# Patient Record
Sex: Female | Born: 1960 | Race: White | Hispanic: No | Marital: Married | Smoking: Current every day smoker
Health system: Southern US, Community
[De-identification: ages and names within clinical notes are randomized; demographics above are authoritative.]

## PROBLEM LIST (undated history)

## (undated) DIAGNOSIS — R7303 Prediabetes: Secondary | ICD-10-CM

## (undated) DIAGNOSIS — K5792 Diverticulitis of intestine, part unspecified, without perforation or abscess without bleeding: Secondary | ICD-10-CM

## (undated) DIAGNOSIS — J45909 Unspecified asthma, uncomplicated: Secondary | ICD-10-CM

## (undated) DIAGNOSIS — Z8616 Personal history of COVID-19: Secondary | ICD-10-CM

## (undated) DIAGNOSIS — M1990 Unspecified osteoarthritis, unspecified site: Secondary | ICD-10-CM

## (undated) DIAGNOSIS — G43909 Migraine, unspecified, not intractable, without status migrainosus: Secondary | ICD-10-CM

## (undated) DIAGNOSIS — R12 Heartburn: Secondary | ICD-10-CM

## (undated) DIAGNOSIS — N200 Calculus of kidney: Secondary | ICD-10-CM

## (undated) DIAGNOSIS — Z720 Tobacco use: Secondary | ICD-10-CM

## (undated) HISTORY — DX: Heartburn: R12

## (undated) HISTORY — DX: Tobacco use: Z72.0

## (undated) HISTORY — DX: Unspecified asthma, uncomplicated: J45.909

## (undated) HISTORY — DX: Personal history of COVID-19: Z86.16

## (undated) HISTORY — DX: Migraine, unspecified, not intractable, without status migrainosus: G43.909

## (undated) HISTORY — DX: Unspecified osteoarthritis, unspecified site: M19.90

## (undated) HISTORY — DX: Calculus of kidney: N20.0

## (undated) HISTORY — DX: Prediabetes: R73.03

## (undated) HISTORY — DX: Diverticulitis of intestine, part unspecified, without perforation or abscess without bleeding: K57.92

---
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
## 1982-02-16 HISTORY — PX: TOTAL ABDOMINAL HYSTERECTOMY: SHX209

## 1983-02-17 HISTORY — PX: APPENDECTOMY: SHX54

## 1991-02-17 HISTORY — PX: GALLBLADDER SURGERY: SHX652

## 2004-05-05 ENCOUNTER — Ambulatory Visit: Payer: Self-pay | Admitting: Family Medicine

## 2004-08-27 ENCOUNTER — Ambulatory Visit: Payer: Self-pay | Admitting: Family Medicine

## 2014-02-16 HISTORY — PX: ROTATOR CUFF REPAIR: SHX139

## 2015-07-24 DIAGNOSIS — M7751 Other enthesopathy of right foot: Secondary | ICD-10-CM | POA: Insufficient documentation

## 2016-06-11 DIAGNOSIS — M797 Fibromyalgia: Secondary | ICD-10-CM | POA: Diagnosis not present

## 2017-05-04 DIAGNOSIS — G8929 Other chronic pain: Secondary | ICD-10-CM | POA: Insufficient documentation

## 2017-05-04 DIAGNOSIS — M7918 Myalgia, other site: Secondary | ICD-10-CM | POA: Insufficient documentation

## 2017-06-23 DIAGNOSIS — L603 Nail dystrophy: Secondary | ICD-10-CM | POA: Insufficient documentation

## 2017-06-23 DIAGNOSIS — B351 Tinea unguium: Secondary | ICD-10-CM | POA: Insufficient documentation

## 2018-05-03 DIAGNOSIS — M549 Dorsalgia, unspecified: Secondary | ICD-10-CM | POA: Insufficient documentation

## 2018-07-15 DIAGNOSIS — M5412 Radiculopathy, cervical region: Secondary | ICD-10-CM | POA: Insufficient documentation

## 2020-02-17 HISTORY — PX: OTHER SURGICAL HISTORY: SHX169

## 2020-04-08 DIAGNOSIS — M545 Low back pain, unspecified: Secondary | ICD-10-CM | POA: Diagnosis not present

## 2020-04-08 DIAGNOSIS — M50122 Cervical disc disorder at C5-C6 level with radiculopathy: Secondary | ICD-10-CM | POA: Diagnosis not present

## 2020-04-08 DIAGNOSIS — M797 Fibromyalgia: Secondary | ICD-10-CM | POA: Diagnosis not present

## 2020-04-08 DIAGNOSIS — R202 Paresthesia of skin: Secondary | ICD-10-CM | POA: Diagnosis not present

## 2020-04-11 DIAGNOSIS — E538 Deficiency of other specified B group vitamins: Secondary | ICD-10-CM | POA: Diagnosis not present

## 2020-04-17 DIAGNOSIS — M4802 Spinal stenosis, cervical region: Secondary | ICD-10-CM | POA: Diagnosis not present

## 2020-04-23 DIAGNOSIS — Z1152 Encounter for screening for COVID-19: Secondary | ICD-10-CM | POA: Diagnosis not present

## 2020-04-25 DIAGNOSIS — Z6841 Body Mass Index (BMI) 40.0 and over, adult: Secondary | ICD-10-CM | POA: Diagnosis not present

## 2020-04-25 DIAGNOSIS — J019 Acute sinusitis, unspecified: Secondary | ICD-10-CM | POA: Diagnosis not present

## 2020-06-03 DIAGNOSIS — R69 Illness, unspecified: Secondary | ICD-10-CM | POA: Diagnosis not present

## 2020-06-03 DIAGNOSIS — Z6841 Body Mass Index (BMI) 40.0 and over, adult: Secondary | ICD-10-CM | POA: Diagnosis not present

## 2020-06-03 DIAGNOSIS — F410 Panic disorder [episodic paroxysmal anxiety] without agoraphobia: Secondary | ICD-10-CM | POA: Diagnosis not present

## 2020-06-13 DIAGNOSIS — R197 Diarrhea, unspecified: Secondary | ICD-10-CM | POA: Diagnosis not present

## 2020-06-13 DIAGNOSIS — Z6841 Body Mass Index (BMI) 40.0 and over, adult: Secondary | ICD-10-CM | POA: Diagnosis not present

## 2020-06-13 DIAGNOSIS — H6691 Otitis media, unspecified, right ear: Secondary | ICD-10-CM | POA: Diagnosis not present

## 2020-06-19 DIAGNOSIS — H52222 Regular astigmatism, left eye: Secondary | ICD-10-CM | POA: Diagnosis not present

## 2020-06-21 DIAGNOSIS — Z0100 Encounter for examination of eyes and vision without abnormal findings: Secondary | ICD-10-CM | POA: Diagnosis not present

## 2020-06-27 DIAGNOSIS — M50122 Cervical disc disorder at C5-C6 level with radiculopathy: Secondary | ICD-10-CM | POA: Diagnosis not present

## 2020-06-27 DIAGNOSIS — Z6841 Body Mass Index (BMI) 40.0 and over, adult: Secondary | ICD-10-CM | POA: Diagnosis not present

## 2020-06-27 DIAGNOSIS — M797 Fibromyalgia: Secondary | ICD-10-CM | POA: Diagnosis not present

## 2020-06-27 DIAGNOSIS — M792 Neuralgia and neuritis, unspecified: Secondary | ICD-10-CM | POA: Diagnosis not present

## 2020-07-08 DIAGNOSIS — J342 Deviated nasal septum: Secondary | ICD-10-CM | POA: Diagnosis not present

## 2020-07-08 DIAGNOSIS — H9193 Unspecified hearing loss, bilateral: Secondary | ICD-10-CM | POA: Diagnosis not present

## 2020-07-08 DIAGNOSIS — H9203 Otalgia, bilateral: Secondary | ICD-10-CM | POA: Diagnosis not present

## 2020-07-08 DIAGNOSIS — H7403 Tympanosclerosis, bilateral: Secondary | ICD-10-CM | POA: Diagnosis not present

## 2020-07-22 DIAGNOSIS — J069 Acute upper respiratory infection, unspecified: Secondary | ICD-10-CM | POA: Diagnosis not present

## 2020-07-22 DIAGNOSIS — Z1152 Encounter for screening for COVID-19: Secondary | ICD-10-CM | POA: Diagnosis not present

## 2020-07-22 DIAGNOSIS — R051 Acute cough: Secondary | ICD-10-CM | POA: Diagnosis not present

## 2020-07-22 DIAGNOSIS — R5383 Other fatigue: Secondary | ICD-10-CM | POA: Diagnosis not present

## 2020-07-31 DIAGNOSIS — H6981 Other specified disorders of Eustachian tube, right ear: Secondary | ICD-10-CM | POA: Diagnosis not present

## 2020-07-31 DIAGNOSIS — H6993 Unspecified Eustachian tube disorder, bilateral: Secondary | ICD-10-CM | POA: Insufficient documentation

## 2020-07-31 DIAGNOSIS — R69 Illness, unspecified: Secondary | ICD-10-CM | POA: Diagnosis not present

## 2020-08-07 DIAGNOSIS — H6983 Other specified disorders of Eustachian tube, bilateral: Secondary | ICD-10-CM | POA: Diagnosis not present

## 2020-08-21 DIAGNOSIS — J069 Acute upper respiratory infection, unspecified: Secondary | ICD-10-CM | POA: Diagnosis not present

## 2020-08-21 DIAGNOSIS — H9203 Otalgia, bilateral: Secondary | ICD-10-CM | POA: Diagnosis not present

## 2020-08-21 DIAGNOSIS — Z6841 Body Mass Index (BMI) 40.0 and over, adult: Secondary | ICD-10-CM | POA: Diagnosis not present

## 2020-09-30 DIAGNOSIS — R519 Headache, unspecified: Secondary | ICD-10-CM | POA: Diagnosis not present

## 2020-09-30 DIAGNOSIS — M542 Cervicalgia: Secondary | ICD-10-CM | POA: Diagnosis not present

## 2020-10-09 DIAGNOSIS — D485 Neoplasm of uncertain behavior of skin: Secondary | ICD-10-CM | POA: Diagnosis not present

## 2020-10-09 DIAGNOSIS — L821 Other seborrheic keratosis: Secondary | ICD-10-CM | POA: Diagnosis not present

## 2020-10-17 DIAGNOSIS — M4802 Spinal stenosis, cervical region: Secondary | ICD-10-CM | POA: Diagnosis not present

## 2020-10-28 DIAGNOSIS — M4802 Spinal stenosis, cervical region: Secondary | ICD-10-CM | POA: Diagnosis not present

## 2020-11-01 DIAGNOSIS — M4802 Spinal stenosis, cervical region: Secondary | ICD-10-CM | POA: Diagnosis not present

## 2020-11-01 DIAGNOSIS — M2578 Osteophyte, vertebrae: Secondary | ICD-10-CM | POA: Diagnosis not present

## 2020-11-01 DIAGNOSIS — M50322 Other cervical disc degeneration at C5-C6 level: Secondary | ICD-10-CM | POA: Diagnosis not present

## 2020-11-01 DIAGNOSIS — M4726 Other spondylosis with radiculopathy, lumbar region: Secondary | ICD-10-CM | POA: Diagnosis not present

## 2020-11-04 DIAGNOSIS — Z9889 Other specified postprocedural states: Secondary | ICD-10-CM | POA: Diagnosis not present

## 2020-11-04 DIAGNOSIS — H1589 Other disorders of sclera: Secondary | ICD-10-CM | POA: Diagnosis not present

## 2020-11-04 DIAGNOSIS — H1133 Conjunctival hemorrhage, bilateral: Secondary | ICD-10-CM | POA: Diagnosis not present

## 2020-11-25 DIAGNOSIS — R030 Elevated blood-pressure reading, without diagnosis of hypertension: Secondary | ICD-10-CM | POA: Diagnosis not present

## 2020-12-11 DIAGNOSIS — M4802 Spinal stenosis, cervical region: Secondary | ICD-10-CM | POA: Diagnosis not present

## 2020-12-23 DIAGNOSIS — K219 Gastro-esophageal reflux disease without esophagitis: Secondary | ICD-10-CM | POA: Diagnosis not present

## 2021-01-13 DIAGNOSIS — Z23 Encounter for immunization: Secondary | ICD-10-CM | POA: Diagnosis not present

## 2021-01-13 DIAGNOSIS — K219 Gastro-esophageal reflux disease without esophagitis: Secondary | ICD-10-CM | POA: Diagnosis not present

## 2021-01-15 DIAGNOSIS — M4802 Spinal stenosis, cervical region: Secondary | ICD-10-CM | POA: Diagnosis not present

## 2021-02-25 DIAGNOSIS — H5213 Myopia, bilateral: Secondary | ICD-10-CM | POA: Diagnosis not present

## 2021-03-03 DIAGNOSIS — M7918 Myalgia, other site: Secondary | ICD-10-CM | POA: Diagnosis not present

## 2021-03-03 DIAGNOSIS — M797 Fibromyalgia: Secondary | ICD-10-CM | POA: Diagnosis not present

## 2021-03-18 DIAGNOSIS — L209 Atopic dermatitis, unspecified: Secondary | ICD-10-CM | POA: Diagnosis not present

## 2021-03-19 DIAGNOSIS — M4802 Spinal stenosis, cervical region: Secondary | ICD-10-CM | POA: Diagnosis not present

## 2021-04-28 DIAGNOSIS — M542 Cervicalgia: Secondary | ICD-10-CM | POA: Diagnosis not present

## 2021-04-28 DIAGNOSIS — R519 Headache, unspecified: Secondary | ICD-10-CM | POA: Diagnosis not present

## 2021-05-05 DIAGNOSIS — J309 Allergic rhinitis, unspecified: Secondary | ICD-10-CM | POA: Diagnosis not present

## 2021-05-09 DIAGNOSIS — J309 Allergic rhinitis, unspecified: Secondary | ICD-10-CM | POA: Diagnosis not present

## 2021-05-12 DIAGNOSIS — H65492 Other chronic nonsuppurative otitis media, left ear: Secondary | ICD-10-CM | POA: Diagnosis not present

## 2021-05-12 DIAGNOSIS — B309 Viral conjunctivitis, unspecified: Secondary | ICD-10-CM | POA: Diagnosis not present

## 2021-05-12 DIAGNOSIS — R69 Illness, unspecified: Secondary | ICD-10-CM | POA: Diagnosis not present

## 2021-05-12 DIAGNOSIS — F17210 Nicotine dependence, cigarettes, uncomplicated: Secondary | ICD-10-CM | POA: Diagnosis not present

## 2021-05-12 DIAGNOSIS — J309 Allergic rhinitis, unspecified: Secondary | ICD-10-CM | POA: Diagnosis not present

## 2021-05-20 ENCOUNTER — Telehealth: Payer: Self-pay

## 2021-05-20 NOTE — Telephone Encounter (Signed)
Patient confirmed appointment.

## 2021-05-21 ENCOUNTER — Ambulatory Visit: Payer: Self-pay | Admitting: Legal Medicine

## 2021-05-27 ENCOUNTER — Encounter: Payer: Self-pay | Admitting: Legal Medicine

## 2021-05-27 ENCOUNTER — Ambulatory Visit (INDEPENDENT_AMBULATORY_CARE_PROVIDER_SITE_OTHER): Payer: Medicare HMO | Admitting: Legal Medicine

## 2021-05-27 VITALS — BP 112/82 | HR 72 | Temp 97.7°F | Ht 59.0 in | Wt 197.2 lb

## 2021-05-27 DIAGNOSIS — Z0000 Encounter for general adult medical examination without abnormal findings: Secondary | ICD-10-CM | POA: Diagnosis not present

## 2021-05-27 DIAGNOSIS — J0100 Acute maxillary sinusitis, unspecified: Secondary | ICD-10-CM | POA: Diagnosis not present

## 2021-05-27 DIAGNOSIS — J0140 Acute pansinusitis, unspecified: Secondary | ICD-10-CM | POA: Diagnosis not present

## 2021-05-27 DIAGNOSIS — F419 Anxiety disorder, unspecified: Secondary | ICD-10-CM | POA: Diagnosis not present

## 2021-05-27 DIAGNOSIS — R69 Illness, unspecified: Secondary | ICD-10-CM | POA: Diagnosis not present

## 2021-05-27 DIAGNOSIS — J019 Acute sinusitis, unspecified: Secondary | ICD-10-CM | POA: Insufficient documentation

## 2021-05-27 MED ORDER — DIAZEPAM 2 MG PO TABS: 2.0000 mg | ORAL_TABLET | Freq: Four times a day (QID) | ORAL | 3 refills | Status: DC | PRN

## 2021-05-27 MED ORDER — CLINDAMYCIN HCL 150 MG PO CAPS: 150.0000 mg | ORAL_CAPSULE | Freq: Three times a day (TID) | ORAL | 0 refills | Status: DC

## 2021-05-27 MED ORDER — TRIAMCINOLONE ACETONIDE 40 MG/ML IJ SUSP
80.0000 mg | Freq: Once | INTRAMUSCULAR | Status: AC
  Administered 2021-05-27: 80 mg via INTRAMUSCULAR

## 2021-05-27 NOTE — Progress Notes (Signed)
? ?Subjective:  ?Patient ID: Sherry Montgomery, female    DOB: 1960/11/12  Age: 61 y.o. MRN: [id number] ? ?Chief Complaint  ?Patient presents with  ? Establish Care  ? ? ?HPI: new patient ?  ?Patient is here to establish care. Patient is here to take about ear issues and headaches for 5 weeks. She has cough recently. No pulmonary disease or treatment.  She has eustation tube dysfunction and chronic tympanic tubes both sides. She is to see ENT  soon. ? ?She is having sinus type headaches ?Chronic anxiety which she uses diazepam. ?Patient including fibromyalgia presently on no medication says she is aching more recently.  She did not like her last physician.  She is to see ENT soon. ? ?Current Outpatient Medications on File Prior to Visit  ?Medication Sig Dispense Refill  ? Naproxen Sodium (ALEVE PO) Take 1 tablet by mouth daily as needed.    ? ?No current facility-administered medications on file prior to visit.  ? ?Past Medical History:  ?Diagnosis Date  ? Arthritis   ? Asthma   ? Diverticulitis   ? Heartburn   ? Kidney stones   ? Migraine   ? ?Past Surgical History:  ?Procedure Laterality Date  ? APPENDECTOMY  1985  ? disc fusion in neck  2022  ? GALLBLADDER SURGERY  1993  ? HERNIA REPAIR  1979  ? ROTATOR CUFF REPAIR  2016  ? TOTAL ABDOMINAL HYSTERECTOMY  1984  ?  ?Family History  ?Problem Relation Age of Onset  ? Alzheimer's disease Mother   ? Lupus Father   ? Lung cancer Sister   ? Leukemia Sister   ? Alzheimer's disease Sister   ? Alzheimer's disease Brother   ? ?Social History  ? ?Socioeconomic History  ? Marital status: Married  ?  Spouse name: Not on file  ? Number of children: 2  ? Years of education: Not on file  ? Highest education level: Not on file  ?Occupational History  ? Not on file  ?Tobacco Use  ? Smoking status: Every Day  ?  Packs/day: 0.50  ?  Types: Cigarettes  ? Smokeless tobacco: Never  ?Vaping Use  ? Vaping Use: Never used  ?Substance and Sexual Activity  ? Alcohol use: Never  ? Drug use:  Never  ? Sexual activity: Yes  ?  Partners: Male  ?Other Topics Concern  ? Not on file  ?Social History Narrative  ? Not on file  ? ?Social Determinants of Health  ? ?Financial Resource Strain: Not on file  ?Food Insecurity: Not on file  ?Transportation Needs: Not on file  ?Physical Activity: Not on file  ?Stress: Not on file  ?Social Connections: Not on file  ? ? ?Review of Systems  ?Constitutional:  Negative for appetite change, chills, fatigue and fever.  ?HENT:  Positive for congestion, ear pain, rhinorrhea and sore throat. Negative for ear discharge and sinus pressure.   ?Eyes:  Negative for visual disturbance.  ?Respiratory:  Positive for cough. Negative for chest tightness, shortness of breath and wheezing.   ?Cardiovascular:  Negative for chest pain, palpitations and leg swelling.  ?Gastrointestinal:  Negative for abdominal pain, diarrhea, nausea and vomiting.  ?Endocrine: Negative for polydipsia, polyphagia and polyuria.  ?Genitourinary:  Negative for difficulty urinating, dysuria, frequency, hematuria, menstrual problem, urgency, vaginal bleeding, vaginal discharge and vaginal pain.  ?Musculoskeletal:  Positive for joint swelling. Negative for back pain, gait problem, myalgias and neck pain.  ?Neurological:  Positive for headaches. Negative for  dizziness, seizures, syncope, weakness and numbness.  ?Psychiatric/Behavioral:  Negative for agitation, confusion, hallucinations, sleep disturbance and suicidal ideas. The patient is not nervous/anxious.   ? ? ?Objective:  ?BP 112/82   Pulse 72   Temp 97.7 ?F (36.5 ?C)   Ht '4\' 11"'$  (1.499 m)   Wt 197 lb 3.2 oz (89.4 kg)   SpO2 96%   BMI 39.83 kg/m?  ? ? ?  05/27/2021  ?  1:54 PM  ?BP/Weight  ?Systolic BP 409  ?Diastolic BP 82  ?Wt. (Lbs) 197.2  ?BMI 39.83 kg/m2  ? ? ?Physical Exam ?Vitals reviewed.  ?Constitutional:   ?   General: She is not in acute distress. ?   Appearance: Normal appearance.  ?HENT:  ?   Head: Normocephalic.  ?   Right Ear: Tympanic membrane  normal.  ?   Left Ear: Tympanic membrane normal.  ?   Nose: Congestion present.  ?   Mouth/Throat:  ?   Mouth: Mucous membranes are moist.  ?   Pharynx: Oropharynx is clear.  ?Eyes:  ?   Extraocular Movements: Extraocular movements intact.  ?   Conjunctiva/sclera: Conjunctivae normal.  ?   Pupils: Pupils are equal, round, and reactive to light.  ?Cardiovascular:  ?   Rate and Rhythm: Normal rate and regular rhythm.  ?   Pulses: Normal pulses.  ?   Heart sounds: Normal heart sounds. No murmur heard. ?  No gallop.  ?Pulmonary:  ?   Effort: Pulmonary effort is normal. No respiratory distress.  ?   Breath sounds: Normal breath sounds. No wheezing.  ?Abdominal:  ?   General: Abdomen is flat. Bowel sounds are normal. There is no distension.  ?   Palpations: Abdomen is soft.  ?   Tenderness: There is no abdominal tenderness.  ?Musculoskeletal:     ?   General: Normal range of motion.  ?   Cervical back: Normal range of motion and neck supple.  ?   Right lower leg: No edema.  ?   Left lower leg: No edema.  ?Skin: ?   General: Skin is warm and dry.  ?   Capillary Refill: Capillary refill takes less than 2 seconds.  ?Neurological:  ?   General: No focal deficit present.  ?   Mental Status: She is alert and oriented to person, place, and time. Mental status is at baseline.  ? ? ? ?  ? ?No results found for: WBC, HGB, HCT, PLT, GLUCOSE, CHOL, TRIG, HDL, LDLDIRECT, LDLCALC, ALT, AST, NA, K, CL, CREATININE, BUN, CO2, TSH, PSA, INR, GLUF, HGBA1C, MICROALBUR ? ? ? ?Assessment & Plan:  ? ?Diagnoses and all orders for this visit: ?Routine general medical examination at a health care facility ?-     CBC with Differential/Platelet ?-     Comprehensive metabolic panel ?-     Lipid panel ?Patient seen for routine physical examination and has been doing well still doing well woman check to get some routine laboratory and then referral back to [facility] she wants to see long-term. ?Acute non-recurrent maxillary sinusitis ?-     clindamycin  (CLEOCIN) 150 MG capsule; Take 1 capsule (150 mg total) by mouth 3 (three) times daily. ?-     triamcinolone acetonide (KENALOG-40) injection 80 mg ?Patient continues to have sinus infection as well as eustachian tube dysfunction and chronic indwelling tympanostomy tubes.  We did give her a second Kenalog 80 mg as well as starting her on clindamycin which is one of  the few antibiotics she can take she was seen the next day and actually is feeling well now.. ? ? ? ?  ? ?Follow-up: Return in about 2 months (around 07/27/2021) for shannon. ? ?An After Visit Summary was printed and given to the patient. ? ?Reinaldo Meeker, MD ?[facility] ?[phone]

## 2021-05-28 ENCOUNTER — Other Ambulatory Visit: Payer: Medicare HMO

## 2021-05-28 DIAGNOSIS — Z0000 Encounter for general adult medical examination without abnormal findings: Secondary | ICD-10-CM | POA: Diagnosis not present

## 2021-05-29 ENCOUNTER — Encounter: Payer: Self-pay | Admitting: Legal Medicine

## 2021-05-29 LAB — COMPREHENSIVE METABOLIC PANEL
ALT: 18 IU/L (ref 0–32)
AST: 15 IU/L (ref 0–40)
Albumin/Globulin Ratio: 1.3 (ref 1.2–2.2)
Albumin: 4 g/dL (ref 3.8–4.9)
Alkaline Phosphatase: 76 IU/L (ref 44–121)
BUN/Creatinine Ratio: 18 (ref 9–23)
BUN: 16 mg/dL (ref 6–24)
Bilirubin Total: 0.3 mg/dL (ref 0.0–1.2)
CO2: 23 mmol/L (ref 20–29)
Calcium: 9.4 mg/dL (ref 8.7–10.2)
Chloride: 106 mmol/L (ref 96–106)
Creatinine, Ser: 0.88 mg/dL (ref 0.57–1.00)
Globulin, Total: 3.1 g/dL (ref 1.5–4.5)
Glucose: 205 mg/dL — ABNORMAL HIGH (ref 70–99)
Potassium: 4.5 mmol/L (ref 3.5–5.2)
Sodium: 142 mmol/L (ref 134–144)
Total Protein: 7.1 g/dL (ref 6.0–8.5)
eGFR: 76 mL/min/{1.73_m2} (ref >59)

## 2021-05-29 LAB — CBC WITH DIFFERENTIAL/PLATELET
Basophils Absolute: 0.1 10*3/uL (ref 0.0–0.2)
Basos: 1 %
EOS (ABSOLUTE): 0.1 10*3/uL (ref 0.0–0.4)
Eos: 1 %
Hematocrit: 45 % (ref 34.0–46.6)
Hemoglobin: 15.4 g/dL (ref 11.1–15.9)
Immature Grans (Abs): 0 10*3/uL (ref 0.0–0.1)
Immature Granulocytes: 0 %
Lymphocytes Absolute: 2.2 10*3/uL (ref 0.7–3.1)
Lymphs: 18 %
MCH: 31.2 pg (ref 26.6–33.0)
MCHC: 34.2 g/dL (ref 31.5–35.7)
MCV: 91 fL (ref 79–97)
Monocytes Absolute: 0.6 10*3/uL (ref 0.1–0.9)
Monocytes: 5 %
Neutrophils Absolute: 9.5 10*3/uL — ABNORMAL HIGH (ref 1.4–7.0)
Neutrophils: 75 %
Platelets: 330 10*3/uL (ref 150–450)
RBC: 4.93 x10E6/uL (ref 3.77–5.28)
RDW: 11.7 % (ref 11.7–15.4)
WBC: 12.6 10*3/uL — ABNORMAL HIGH (ref 3.4–10.8)

## 2021-05-29 LAB — LIPID PANEL
Chol/HDL Ratio: 4.6 ratio — ABNORMAL HIGH (ref 0.0–4.4)
Cholesterol, Total: 190 mg/dL (ref 100–199)
HDL: 41 mg/dL (ref >39)
LDL Chol Calc (NIH): 125 mg/dL — ABNORMAL HIGH (ref 0–99)
Triglycerides: 133 mg/dL (ref 0–149)
VLDL Cholesterol Cal: 24 mg/dL (ref 5–40)

## 2021-05-29 LAB — CARDIOVASCULAR RISK ASSESSMENT

## 2021-05-29 NOTE — Progress Notes (Signed)
Wbc 12, 600, 9.5 % neutrophils may be from sinusitis, glucose 205, kidney tests normal, liver tests normal, LDL cholesterol 125 high, need DASH diet ?lp

## 2021-05-30 DIAGNOSIS — H524 Presbyopia: Secondary | ICD-10-CM | POA: Diagnosis not present

## 2021-05-30 DIAGNOSIS — H52209 Unspecified astigmatism, unspecified eye: Secondary | ICD-10-CM | POA: Diagnosis not present

## 2021-05-30 DIAGNOSIS — H5213 Myopia, bilateral: Secondary | ICD-10-CM | POA: Diagnosis not present

## 2021-06-02 ENCOUNTER — Telehealth: Payer: Self-pay | Admitting: Nurse Practitioner

## 2021-06-02 NOTE — Telephone Encounter (Signed)
Pt called stating she received a letter for jury duty. She stated she can not sit or stand within 30 mins do to her arthritis in her spine. I told her we would contact her back Larene Beach is out of the office. Waiting to hear back rather or not she will need an in office appt with np for letter for jury duty.  ?

## 2021-06-05 NOTE — Telephone Encounter (Addendum)
Patient called back to follow up on her [place] request. Patient was notified that this message will need to be addressed by Dr. Henrene Pastor being that Larene Beach has not seen her yet. Patient was notified that this message has been forward to Dr. Henrene Pastor and someone should try to contact her back this afternoon for tomorrow morning. ?

## 2021-06-06 NOTE — Telephone Encounter (Signed)
Patient was notified of what Dr. Henrene Pastor had stated. Patient asked that her medical records be reviewed again. 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 Patient stated to me that she is currently on disability due to her neck, arthritis, and fibromyalgia. Dr. Henrene Pastor, please let me know if you are able to do the letter or not and I will call her back to let her know. ?

## 2021-06-06 NOTE — Telephone Encounter (Signed)
Talk with Larene Beach and she will not find a medical reason to be dismissed from [employer]. I explained to patient. ?

## 2021-06-10 ENCOUNTER — Ambulatory Visit: Payer: Medicare HMO | Admitting: Nurse Practitioner

## 2021-06-11 DIAGNOSIS — M542 Cervicalgia: Secondary | ICD-10-CM | POA: Diagnosis not present

## 2021-06-11 DIAGNOSIS — R519 Headache, unspecified: Secondary | ICD-10-CM | POA: Diagnosis not present

## 2021-06-11 DIAGNOSIS — M4802 Spinal stenosis, cervical region: Secondary | ICD-10-CM | POA: Diagnosis not present

## 2021-06-11 DIAGNOSIS — Z981 Arthrodesis status: Secondary | ICD-10-CM | POA: Diagnosis not present

## 2021-06-11 DIAGNOSIS — I6782 Cerebral ischemia: Secondary | ICD-10-CM | POA: Diagnosis not present

## 2021-06-18 DIAGNOSIS — L301 Dyshidrosis [pompholyx]: Secondary | ICD-10-CM | POA: Diagnosis not present

## 2021-06-23 ENCOUNTER — Ambulatory Visit (INDEPENDENT_AMBULATORY_CARE_PROVIDER_SITE_OTHER): Payer: Medicare HMO | Admitting: Nurse Practitioner

## 2021-06-23 ENCOUNTER — Encounter: Payer: Self-pay | Admitting: Nurse Practitioner

## 2021-06-23 ENCOUNTER — Ambulatory Visit: Payer: Medicare HMO | Admitting: Physician Assistant

## 2021-06-23 VITALS — BP 124/80 | HR 92 | Temp 97.1°F | Ht 59.0 in | Wt 196.0 lb

## 2021-06-23 DIAGNOSIS — R739 Hyperglycemia, unspecified: Secondary | ICD-10-CM

## 2021-06-23 DIAGNOSIS — G43709 Chronic migraine without aura, not intractable, without status migrainosus: Secondary | ICD-10-CM

## 2021-06-23 MED ORDER — NURTEC 75 MG PO TBDP: 75.0000 mg | ORAL_TABLET | ORAL | 1 refills | Status: DC

## 2021-06-23 NOTE — Progress Notes (Signed)
? ?Acute Office Visit ? ?Subjective:  ? ? Patient ID: Sherry Montgomery, female    DOB: September 13, 1960, 61 y.o.   MRN: [id number] ? ?Chief Complaint  ?Patient presents with  ? Migraines  ? ? ?HPI: ?Patient is in today for chronic migraines. Onset was several years ago. States migraines had subsided years ago but returned after having COVID-03 Feb 2021. Symptoms include partial brief vision loss with a white spot, dark zig-zag lines, headache,"tingling scalp sensation" nausea, photophobia, and phonophobia.  Treatment has included Aleve. She has been evaluated by ENT and Optometry. . Past cervical surgery with hardware.  ? ?EMR review reveals elevated blood glucose of 205 on 05/28/21. Pt states she has had "borderline" elevated blood sugars in the past and family history of type 2 DM.  ? ?Past Medical History:  ?Diagnosis Date  ? Arthritis   ? Asthma   ? Diverticulitis   ? Heartburn   ? Kidney stones   ? Migraine   ? ? ?Past Surgical History:  ?Procedure Laterality Date  ? APPENDECTOMY  1985  ? disc fusion in neck  2022  ? GALLBLADDER SURGERY  1993  ? HERNIA REPAIR  1979  ? ROTATOR CUFF REPAIR  2016  ? TOTAL ABDOMINAL HYSTERECTOMY  1984  ? ? ?Family History  ?Problem Relation Age of Onset  ? Alzheimer's disease Mother   ? Lupus Father   ? Lung cancer Sister   ? Leukemia Sister   ? Alzheimer's disease Sister   ? Alzheimer's disease Brother   ? ? ?Social History  ? ?Socioeconomic History  ? Marital status: Married  ?  Spouse name: Not on file  ? Number of children: 2  ? Years of education: Not on file  ? Highest education level: Not on file  ?Occupational History  ? Not on file  ?Tobacco Use  ? Smoking status: Every Day  ?  Packs/day: 0.50  ?  Types: Cigarettes  ? Smokeless tobacco: Never  ?Vaping Use  ? Vaping Use: Never used  ?Substance and Sexual Activity  ? Alcohol use: Never  ? Drug use: Never  ? Sexual activity: Yes  ?  Partners: Male  ?Other Topics Concern  ? Not on file  ?Social History Narrative  ? Not on file   ? ?Social Determinants of Health  ? ?Financial Resource Strain: Not on file  ?Food Insecurity: Not on file  ?Transportation Needs: Not on file  ?Physical Activity: Not on file  ?Stress: Not on file  ?Social Connections: Not on file  ?Intimate Partner Violence: Not on file  ? ? ?Outpatient Medications Prior to Visit  ?Medication Sig Dispense Refill  ? diazepam (VALIUM) 2 MG tablet Take 1 tablet (2 mg total) by mouth every 6 (six) hours as needed. 30 tablet 3  ? Naproxen Sodium (ALEVE PO) Take 1 tablet by mouth daily as needed.    ? clindamycin (CLEOCIN) 150 MG capsule Take 1 capsule (150 mg total) by mouth 3 (three) times daily. 30 capsule 0  ? ?No facility-administered medications prior to visit.  ? ? ?Allergies  ?Allergen Reactions  ? Codeine Anaphylaxis  ?  Throat swelling ?Throat swelling ?Throat swelling ?  ? Shellfish Allergy Anaphylaxis  ?  Other reaction(s): Shock (ALLERGY) ?Other reaction(s): Shock (ALLERGY) ?  ? Ciprofloxacin Nausea And Vomiting  ? Erythromycin   ? Ibuprofen Other (See Comments)  ?  Other reaction(s): Sweating (intolerance) ?Other reaction(s): Sweating (intolerance) ?  ? Silver Nitrate Swelling  ? Sulfa Antibiotics   ?  Tramadol Other (See Comments)  ?  ask ?ask ?ask ?  ? Tramadol Hcl   ? Acetaminophen Rash  ? Ampicillin Rash  ? Cefdinir Rash  ? Cephalexin Rash  ? Doxycycline Rash  ? Fish-Derived Products Rash  ? Hydrocodone-Acetaminophen Nausea And Vomiting and Rash  ?  ask ?ask ?ask ?  ? Sulfamethoxazole Rash  ? ? ?Review of Systems  ?Constitutional:  Positive for fatigue.  ?Eyes:  Positive for photophobia and visual disturbance (intermittent).  ?Gastrointestinal:  Positive for nausea. Negative for vomiting.  ?Neurological:  Positive for headaches. Negative for dizziness.  ? ?   ?Objective:  ?  ?Physical Exam ?Vitals reviewed.  ?Constitutional:   ?   Appearance: Normal appearance.  ?HENT:  ?   Right Ear: Tympanic membrane normal.  ?   Left Ear: Tympanic membrane normal.  ?   Nose: No  congestion or rhinorrhea.  ?   Mouth/Throat:  ?   Pharynx: No posterior oropharyngeal erythema.  ?Eyes:  ?   Pupils: Pupils are equal, round, and reactive to light.  ?Skin: ?   General: Skin is warm and dry.  ?   Capillary Refill: Capillary refill takes less than 2 seconds.  ?Neurological:  ?   General: No focal deficit present.  ?   Mental Status: She is alert and oriented to person, place, and time.  ?Psychiatric:     ?   Mood and Affect: Mood normal.     ?   Behavior: Behavior normal.  ? ? ?BP 124/80   Pulse 92   Temp (!) 97.1 ?F (36.2 ?C)   Ht 4' 11"  (1.499 m)   Wt 196 lb (88.9 kg)   SpO2 95%   BMI 39.59 kg/m?   ?Wt Readings from Last 3 Encounters:  ?06/23/21 196 lb (88.9 kg)  ?05/27/21 197 lb 3.2 oz (89.4 kg)  ? ? ?Health Maintenance Due  ?Topic Date Due  ? COVID-19 Vaccine (1) Never done  ? HIV Screening  Never done  ? Hepatitis C Screening  Never done  ? TETANUS/TDAP  Never done  ? PAP SMEAR-Modifier  Never done  ? COLONOSCOPY (Pts 45-27yr Insurance coverage will need to be confirmed)  Never done  ? MAMMOGRAM  Never done  ? Zoster Vaccines- Shingrix (1 of 2) Never done  ? ? ? ?Lab Results  ?Component Value Date  ? WBC 12.6 (H) 05/28/2021  ? HGB 15.4 05/28/2021  ? HCT 45.0 05/28/2021  ? MCV 91 05/28/2021  ? PLT 330 05/28/2021  ? ?Lab Results  ?Component Value Date  ? NA 142 05/28/2021  ? K 4.5 05/28/2021  ? CO2 23 05/28/2021  ? GLUCOSE 205 (H) 05/28/2021  ? BUN 16 05/28/2021  ? CREATININE 0.88 05/28/2021  ? BILITOT 0.3 05/28/2021  ? ALKPHOS 76 05/28/2021  ? AST 15 05/28/2021  ? ALT 18 05/28/2021  ? PROT 7.1 05/28/2021  ? ALBUMIN 4.0 05/28/2021  ? CALCIUM 9.4 05/28/2021  ? EGFR 76 05/28/2021  ? ?Lab Results  ?Component Value Date  ? CHOL 190 05/28/2021  ? ?Lab Results  ?Component Value Date  ? HDL 41 05/28/2021  ? ?Lab Results  ?Component Value Date  ? LDLCALC 125 (H) 05/28/2021  ? ?Lab Results  ?Component Value Date  ? TRIG 133 05/28/2021  ? ?Lab Results  ?Component Value Date  ? CHOLHDL 4.6 (H)  05/28/2021  ? ? ? ?   ?Assessment & Plan:  ? ?1. Chronic migraine without aura without status migrainosus, not intractable ?-  Rimegepant Sulfate (NURTEC) 75 MG TBDP; Take 75 mg by mouth every other day.  Dispense: 30 tablet; Refill: 1 ? ?2. Blood glucose elevated ?- Hemoglobin A1c ?  ? ?Begin Nurtec 75 mg every other day for migraine ?Take Aleve as directed ?Follow-up in 4-weeks for migraine management ?We will call you with lab results ?  ? ?Follow-up: 4-weeks ? ?An After Visit Summary was printed and given to the patient. ? ?I, Rip Harbour, NP, have reviewed all documentation for this visit. The documentation on 06/23/21 for the exam, diagnosis, procedures, and orders are all accurate and complete.  ? ? ?Signed, ?Rip Harbour, NP ?[place] ?[phone] ?

## 2021-06-23 NOTE — Patient Instructions (Addendum)
Begin Nurtec 75 mg every other day for migraine ?Take Aleve as directed ?Follow-up in 4-weeks for migraine management ?We will call you with lab results ? ? ? ?Chronic Migraine Headache ?A migraine headache is throbbing pain that is usually on one side of the head. Migraines that keep coming back are called recurring migraines. A migraine is called a chronic migraine if it happens at least 15 days in a month for more than 3 months. ?Talk with your doctor about what things may bring on (trigger) your migraines. ?What are the causes? ?The exact cause of this condition is not known. A migraine may be caused when nerves in the brain become irritated and release chemicals that cause irritation and swelling (inflammation) of blood vessels. The irritation and swelling of the blood vessels causes pain. ?Migraines may be brought on or caused by: ?Smoking. ?Foods and drinks, such as: ?Cheese. ?Chocolate. ?Alcohol. ?Caffeine. ?Certain substances in some foods or drinks. ?Some medicines. ?Other things that may bring on a migraine include: ?Periods, for women. ?Stress. ?Not enough sleep or too much sleep. ?Feeling very tired. ?Bright lights or loud noises. ?Smells ?Weather changes and being at high altitude. ?What increases the risk? ?The following factors may make you more likely to have chronic migraine: ?Having migraines or family members who have them. ?Being very sad (depressed) or feeling worried or nervous (anxious). ?Taking a lot of pain medicine. ?Having problems sleeping. ?Having heart disease, diabetes, or being very overweight (obese). ?What are the signs or symptoms? ?Symptoms of this condition include: ?Pain that feels like it throbs. ?Pain that is usually only on one side of the head. In some cases, the pain may be on both sides of the head or around the head or neck. ?Very bad pain that keeps you from doing daily activities. ?Pain that gets worse with activity. ?Feeling like you may vomit (feeling nauseous) or  vomiting. ?Pain when you are around bright lights, loud noises, or activity. ?Being sensitive to bright lights, loud noises, or smells. ?Feeling dizzy. ?How is this treated? ?This condition is treated with: ?Medicines. These help to: ?Lessen pain and the feeling like you may vomit. ?Prevent migraines. ?Changes to your diet or sleep. ?Therapy. This might include: ?Relaxation training. ?Biofeedback. This is a treatment that teaches you to relax, use your brain to lower your heart rate, and control your breathing. ?Cognitive behavioral therapy (CBT). This therapy helps you set goals and follow up on the changes that you make. ?Acupuncture. ?Using a device that provides electrical stimulation to your nerves, which can help take away pain. ?Surgery, if the other treatments do not work. ?Follow these instructions at home: ?Medicines ?Take over-the-counter and prescription medicines only as told by your doctor. ?Ask your doctor if the medicine prescribed to you requires you to avoid driving or using machinery. ?Lifestyle ? ?Do not use any products that contain nicotine or tobacco, such as cigarettes, e-cigarettes, and chewing tobacco. If you need help quitting, ask your doctor. ?Do not drink alcohol. ?Get 7-9 hours of sleep each night. ?Lower the stress in your life. Ask your doctor about ways to do this. ?Stay at a healthy weight. Talk with your doctor if you need help losing weight. ?Get regular exercise. ?General instructions ? ?Keep a journal to find out if certain things bring on migraines. For example, write down: ?What you eat and drink. ?How much sleep you get. ?Any change to your diet or medicines. ?Lie down in a dark, quiet room when you have  a migraine. ?Try placing a cool towel over your head when you have a migraine. ?Keep lights dim if bright lights bother you or make your migraines worse. ?Keep all follow-up visits as told by your doctor. This is important. ?Where to find more information ?Coalition for  Headache and Migraine Patients (CHAMP): headachemigraine.org ?American Migraine Foundation: americanmigrainefoundation.org ?National Headache Foundation: headaches.org ?Contact a doctor if: ?Medicine does not help your migraine. ?Your pain keeps coming back. ?Get help right away if: ?Your migraine becomes really bad and medicine does not help. ?You have a stiff neck and fever. ?You have trouble seeing. ?Your muscles are weak or you lose control of them. ?You lose your balance or have trouble walking. ?You feel like you will faint or you faint. ?You start having sudden, very bad headaches. ?You have a seizure. ?Summary ?A migraine headache is very bad, throbbing pain that is usually on one side of the head. ?A chronic migraine is a migraine that happens 15 days in a month for more than 3 months. ?Talk with your doctor about what things may bring on your migraines. ?Lie down in a dark, quiet room when you have a migraine. ?Keep a journal. This can help you find out if certain things make you have migraines. ?This information is not intended to replace advice given to you by your health care provider. Make sure you discuss any questions you have with your health care provider. ?Document Revised: 03/22/2019 Document Reviewed: 03/22/2019 ?Elsevier Patient Education ? [place]. ? ?

## 2021-06-24 ENCOUNTER — Encounter: Payer: Self-pay | Admitting: Nurse Practitioner

## 2021-06-24 LAB — HEMOGLOBIN A1C
Est. average glucose Bld gHb Est-mCnc: 146 mg/dL
Hgb A1c MFr Bld: 6.7 % — ABNORMAL HIGH (ref 4.8–5.6)

## 2021-06-25 ENCOUNTER — Telehealth: Payer: Self-pay

## 2021-06-25 NOTE — Telephone Encounter (Signed)
Prior auth for Nurtec 75 mg is approved.  ?

## 2021-06-26 ENCOUNTER — Other Ambulatory Visit: Payer: Self-pay

## 2021-06-26 MED ORDER — METFORMIN HCL 500 MG PO TABS: 500.0000 mg | ORAL_TABLET | Freq: Two times a day (BID) | ORAL | 0 refills | Status: DC

## 2021-07-01 ENCOUNTER — Ambulatory Visit (INDEPENDENT_AMBULATORY_CARE_PROVIDER_SITE_OTHER): Payer: Medicare HMO

## 2021-07-01 DIAGNOSIS — Z1231 Encounter for screening mammogram for malignant neoplasm of breast: Secondary | ICD-10-CM

## 2021-07-01 DIAGNOSIS — Z13820 Encounter for screening for osteoporosis: Secondary | ICD-10-CM

## 2021-07-01 DIAGNOSIS — Z0000 Encounter for general adult medical examination without abnormal findings: Secondary | ICD-10-CM

## 2021-07-01 NOTE — Progress Notes (Signed)
Subjective:   Sherry Montgomery is a 61 y.o. female who presents for Medicare Annual (Subsequent) preventive examination.  I connected with  Sherry Montgomery on 07/01/21 by a audio enabled telemedicine application and verified that I am speaking with the correct person using two identifiers.  Patient Location: Home  Provider Location: Home Office  I discussed the limitations of evaluation and management by telemedicine. The patient expressed understanding and agreed to proceed.  Cardiac Risk Factors include: advanced age (>38mn, >>102women)     Objective:    There were no vitals filed for this visit. There is no height or weight on file to calculate BMI.     07/01/2021    2:12 PM  Advanced Directives  Does Patient Have a Medical Advance Directive? No  Would patient like information on creating a medical advance directive? No - Patient declined    Current Medications (verified) Outpatient Encounter Medications as of 07/01/2021  Medication Sig   diazepam (VALIUM) 2 MG tablet Take 1 tablet (2 mg total) by mouth every 6 (six) hours as needed.   metFORMIN (GLUCOPHAGE) 500 MG tablet Take 1 tablet (500 mg total) by mouth 2 (two) times daily with a meal.   Naproxen Sodium (ALEVE PO) Take 1 tablet by mouth daily as needed.   Rimegepant Sulfate (NURTEC) 75 MG TBDP Take 75 mg by mouth every other day.   No facility-administered encounter medications on file as of 07/01/2021.    Allergies (verified) Codeine, Shellfish allergy, Ciprofloxacin, Erythromycin, Ibuprofen, Silver nitrate, Sulfa antibiotics, Tramadol, Tramadol hcl, Acetaminophen, Ampicillin, Cefdinir, Cephalexin, Doxycycline, Fish-derived products, Hydrocodone-acetaminophen, and Sulfamethoxazole   History: Past Medical History:  Diagnosis Date   Arthritis    Asthma    Diverticulitis    Heartburn    History of COVID-19    Kidney stones    Migraine    Past Surgical History:  Procedure Laterality Date   APPENDECTOMY   1985   disc fusion in neck  2022   [place]  ROTATOR CUFF REPAIR  2016   TOTAL ABDOMINAL HYSTERECTOMY  1984   Family History  Problem Relation Age of Onset   Alzheimer's disease Mother    Lupus Father    Lung cancer Sister    Leukemia Sister    Alzheimer's disease Sister    Alzheimer's disease Brother    Social History   Socioeconomic History   Marital status: Married    Spouse name: Not on file   Number of children: 2   Years of education: Not on file   Highest education level: Not on file  Occupational History   Not on file  Tobacco Use   Smoking status: Every Day    Packs/day: 0.50    Types: Cigarettes   Smokeless tobacco: Never  Vaping Use   Vaping Use: Never used  Substance and Sexual Activity   Alcohol use: Never   Drug use: Never   Sexual activity: Yes    Partners: Male  Other Topics Concern   Not on file  Social History Narrative   Not on file   Social Determinants of Health   Financial Resource Strain: Low Risk    Difficulty of Paying Living Expenses: Not hard at all  Food Insecurity: No Food Insecurity   Worried About RCharity fundraiserin the Last Year: Never true   Ran Out of Food in the Last Year: Never true  Transportation Needs: No Transportation  Needs   Lack of Transportation (Medical): No   Lack of Transportation (Non-Medical): No  Physical Activity: Inactive   Days of Exercise per Week: 0 days   Minutes of Exercise per Session: 0 min  Stress: No Stress Concern Present   Feeling of Stress : Not at all  Social Connections: Moderately Integrated   Frequency of Communication with Friends and Family: Three times a week   Frequency of Social Gatherings with Friends and Family: Once a week   Attends Religious Services: More than 4 times per year   Active Member of [employer] or Organizations: No   Attends Archivist Meetings: Never   Marital Status: Married    Tobacco Counseling Ready to quit:  Not Answered Counseling given: Not Answered   Clinical Intake:                 Diabetic?Yes          Activities of Daily Living    07/01/2021    2:12 PM  In your present state of health, do you have any difficulty performing the following activities:  Hearing? 0  Vision? 0  Difficulty concentrating or making decisions? 1  Walking or climbing stairs? 0  Dressing or bathing? 0  Doing errands, shopping? 0  Preparing Food and eating ? N  Using the Toilet? N  In the past six months, have you accidently leaked urine? N  Do you have problems with loss of bowel control? N  Managing your Medications? N  Managing your Finances? N  Housekeeping or managing your Housekeeping? N    Patient Care Team: Rip Harbour, NP as PCP - General (Nurse Practitioner)  Indicate any recent Medical Services you may have received from other than [facility] providers in the past year (date may be approximate).     Assessment:   This is a routine wellness examination for Sherry Montgomery.  Hearing/Vision screen No results found.  Dietary issues and exercise activities discussed: Current Exercise Habits: The patient does not participate in regular exercise at present, Exercise limited by: None identified   Goals Addressed   None   Depression Screen    07/01/2021    2:10 PM 05/27/2021    1:58 PM  PHQ 2/9 Scores  PHQ - 2 Score 0 0    Fall Risk    07/01/2021    2:12 PM 05/27/2021    1:58 PM  [place] in the past year? 0 0  Number falls in past yr: 0 0  Injury with Fall? 0 0  Risk for fall due to : No Fall Risks   Follow up Falls prevention discussed     [place]:  Any stairs in or around the home? Yes  If so, are there any without handrails? Yes  Home free of loose throw rugs in walkways, pet beds, electrical cords, etc? Yes  Adequate lighting in your home to reduce risk of falls? Yes   ASSISTIVE DEVICES UTILIZED TO PREVENT  FALLS:  Life alert? No  Use of a cane, walker or w/c? No  Grab bars in the bathroom? No  Shower chair or bench in shower? No  Elevated toilet seat or a handicapped toilet? Yes   TIMED UP AND GO:  Was the test performed?  N/A .  Length of time to ambulate 10 feet: N/A sec.    Cognitive Function:        Immunizations  There is no immunization  history on file for this patient.  TDAP status: Due, Education has been provided regarding the importance of this vaccine. Advised may receive this vaccine at local pharmacy or Health Dept. Aware to provide a copy of the vaccination record if obtained from local pharmacy or Health Dept. Verbalized acceptance and understanding.  Flu Vaccine status: Up to date  Pneumococcal vaccine status: Declined,  Education has been provided regarding the importance of this vaccine but patient still declined. Advised may receive this vaccine at local pharmacy or Health Dept. Aware to provide a copy of the vaccination record if obtained from local pharmacy or Health Dept. Verbalized acceptance and understanding.   Covid-19 vaccine status: Declined, Education has been provided regarding the importance of this vaccine but patient still declined. Advised may receive this vaccine at local pharmacy or Health Dept.or vaccine clinic. Aware to provide a copy of the vaccination record if obtained from local pharmacy or Health Dept. Verbalized acceptance and understanding.  Qualifies for Shingles Vaccine? Yes   Zostavax completed No   Shingrix Completed?: No.    Education has been provided regarding the importance of this vaccine. Patient has been advised to call insurance company to determine out of pocket expense if they have not yet received this vaccine. Advised may also receive vaccine at local pharmacy or Health Dept. Verbalized acceptance and understanding.  Screening Tests Health Maintenance  Topic Date Due   COVID-19 Vaccine (1) Never done   HIV Screening   Never done   Hepatitis C Screening  Never done   TETANUS/TDAP  Never done   PAP SMEAR-Modifier  Never done   COLONOSCOPY (Pts 45-58yr Insurance coverage will need to be confirmed)  Never done   MAMMOGRAM  Never done   Zoster Vaccines- Shingrix (1 of 2) Never done   INFLUENZA VACCINE  09/16/2021   HPV VACCINES  Aged Out    Health Maintenance  Health Maintenance Due  Topic Date Due   COVID-19 Vaccine (1) Never done   HIV Screening  Never done   Hepatitis C Screening  Never done   TETANUS/TDAP  Never done   PAP SMEAR-Modifier  Never done   COLONOSCOPY (Pts 45-442yrInsurance coverage will need to be confirmed)  Never done   MAMMOGRAM  Never done   Zoster Vaccines- Shingrix (1 of 2) Never done    Colorectal cancer screening: Type of screening: Colonoscopy. Completed  . Repeat every 10 years  Mammogram status: Ordered 07/01/2021. Pt provided with contact info and advised to call to schedule appt.   Bone Density status: Ordered 07/01/2021. Pt provided with contact info and advised to call to schedule appt.  Lung Cancer Screening: (Low Dose CT Chest recommended if Age 60-80ears, 30 pack-year currently smoking OR have quit w/in 15years.) does qualify.   Lung Cancer Screening Referral: Patient Denied  Additional Screening:  Hepatitis C Screening: does qualify; Completed Patient denied  Vision Screening: Recommended annual ophthalmology exams for early detection of glaucoma and other disorders of the eye. Is the patient up to date with their annual eye exam?  Yes  Who is the provider or what is the name of the office in which the patient attends annual eye exams? [facility] provider, would they like to be referred to a provider to establish care? No .   Dental Screening: Recommended annual dental exams for proper oral hygiene  Community Resource Referral / Chronic Care Management: CRR required this visit?  No  CCM required this visit?   No      Plan:     I have personally reviewed and noted the following in the patient's chart:   Medical and social history Use of alcohol, tobacco or illicit drugs  Current medications and supplements including opioid prescriptions.  Functional ability and status Nutritional status Physical activity Advanced directives List of other physicians Hospitalizations, surgeries, and ER visits in previous 12 months Vitals Screenings to include cognitive, depression, and falls Referrals and appointments  In addition, I have reviewed and discussed with patient certain preventive protocols, quality metrics, and best practice recommendations. A written personalized care plan for preventive services as well as general preventive health recommendations were provided to patient.     Burna Forts, [place]   07/01/2021   Nurse Notes: Non Face to Face, 40 minutes  Ms. Mccants , Thank you for taking time to come for your Medicare Wellness Visit. I appreciate your ongoing commitment to your health goals. Please review the following plan we discussed and let me know if I can assist you in the future.   These are the goals we discussed:  Goals   None     This is a list of the screening recommended for you and due dates:  Health Maintenance  Topic Date Due   COVID-19 Vaccine (1) Never done   HIV Screening  Never done   Hepatitis C Screening: USPSTF Recommendation to screen - Ages 25-79 yo.  Never done   Tetanus Vaccine  Never done   Pap Smear  Never done   Colon Cancer Screening  Never done   Mammogram  Never done   Zoster (Shingles) Vaccine (1 of 2) Never done   Flu Shot  09/16/2021   HPV Vaccine  Aged Out

## 2021-07-08 ENCOUNTER — Encounter: Payer: Self-pay | Admitting: Nurse Practitioner

## 2021-07-09 ENCOUNTER — Ambulatory Visit
Admission: RE | Admit: 2021-07-09 | Discharge: 2021-07-09 | Disposition: A | Discharge status: Home / Self Care | Payer: Medicare HMO | Source: Ambulatory Visit | Attending: Nurse Practitioner | Admitting: Nurse Practitioner

## 2021-07-09 ENCOUNTER — Other Ambulatory Visit: Payer: Self-pay | Admitting: Nurse Practitioner

## 2021-07-09 DIAGNOSIS — Z1231 Encounter for screening mammogram for malignant neoplasm of breast: Secondary | ICD-10-CM

## 2021-07-09 DIAGNOSIS — Z0000 Encounter for general adult medical examination without abnormal findings: Secondary | ICD-10-CM

## 2021-07-29 ENCOUNTER — Ambulatory Visit: Payer: Medicare HMO | Admitting: Nurse Practitioner

## 2021-07-31 DIAGNOSIS — N959 Unspecified menopausal and perimenopausal disorder: Secondary | ICD-10-CM | POA: Diagnosis not present

## 2021-07-31 DIAGNOSIS — M85851 Other specified disorders of bone density and structure, right thigh: Secondary | ICD-10-CM | POA: Diagnosis not present

## 2021-07-31 LAB — DG BONE DENSITY

## 2021-08-01 ENCOUNTER — Other Ambulatory Visit: Payer: Self-pay

## 2021-08-01 DIAGNOSIS — Z0000 Encounter for general adult medical examination without abnormal findings: Secondary | ICD-10-CM

## 2021-08-01 DIAGNOSIS — Z13820 Encounter for screening for osteoporosis: Secondary | ICD-10-CM

## 2021-08-01 DIAGNOSIS — M85859 Other specified disorders of bone density and structure, unspecified thigh: Secondary | ICD-10-CM

## 2021-08-01 MED ORDER — ALENDRONATE SODIUM 70 MG PO TABS: 70.0000 mg | ORAL_TABLET | ORAL | 11 refills | Status: DC

## 2021-08-01 NOTE — Progress Notes (Signed)
Fosamax sent for osteopenia per [place] -- patient's mailbox was full, mychart message sent.

## 2021-08-18 ENCOUNTER — Telehealth: Payer: Self-pay

## 2021-08-18 NOTE — Telephone Encounter (Addendum)
Patient called stated she is having lower abd and back pain, and pain in her groin on the left side. Stated she has had kidney stones before and feels that what is, was wonder if she could have something for pain. Please advise.  Per shannon patient should go to Urgent care or ER to be seen.  Patient verbalized understanding.

## 2021-08-28 NOTE — Progress Notes (Signed)
Chief Complaint  Patient presents with   Migraine   Diabetes    Subjective:  Patient ID: Sherry Montgomery, female    DOB: 30-Dec-1960  Age: 61 y.o. MRN: [id number]    HPI  Sherry Montgomery presents for follow-up of T2DM that was diagnosed this May 2023. She was prescribed Metformin 500 mg BID. She has discontinued medication on her own. States she has modified her diet and increased physical activity. States she hears bilateral "whooshing" in her ears and mild dizziness when turning her head intermittently. 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 Reports she has been swimming in her pool regularly, has had fluid drain from her right ear. She is a long-term cigarette smoker.     Diabetes Mellitus Type II, Follow-up  Lab Results  Component Value Date   HGBA1C 6.7 (H) 06/23/2021   Wt Readings from Last 3 Encounters:  08/29/21 194 lb (88 kg)  06/23/21 196 lb (88.9 kg)  05/27/21 197 lb 3.2 oz (89.4 kg)   Last seen for diabetes 6 months ago.  Management  includes diet and exercise. D/c metformin on her own. She reports poor compliance with treatment.  Home blood sugar records: fasting range: 104-130  Most Recent Eye Exam:Feb 2023 Current exercise: none Current diet habits: in general, a "healthy" diet    Pertinent Labs: Lab Results  Component Value Date   CHOL 190 05/28/2021   HDL 41 05/28/2021   LDLCALC 125 (H) 05/28/2021   TRIG 133 05/28/2021   CHOLHDL 4.6 (H) 05/28/2021   Lab Results  Component Value Date   NA 142 05/28/2021   K 4.5 05/28/2021   CREATININE 0.88 05/28/2021   EGFR 76 05/28/2021   GLUCOSE 205 (H) 05/28/2021         Migraines: Patient states she never started nurtec and her migraines subsided after her husbands stress test resulted as normal. Current Outpatient Medications on File Prior to Visit  Medication Sig Dispense Refill   alendronate (FOSAMAX) 70 MG tablet Take 1 tablet (70 mg total) by mouth once a week. Take with a full glass  of water on an empty stomach. 4 tablet 11   diazepam (VALIUM) 2 MG tablet Take 1 tablet (2 mg total) by mouth every 6 (six) hours as needed. 30 tablet 3   metFORMIN (GLUCOPHAGE) 500 MG tablet Take 1 tablet (500 mg total) by mouth 2 (two) times daily with a meal. (Patient not taking: Reported on 08/29/2021) 180 tablet 0   Naproxen Sodium (ALEVE PO) Take 1 tablet by mouth daily as needed.     Rimegepant Sulfate (NURTEC) 75 MG TBDP Take 75 mg by mouth every other day. (Patient not taking: Reported on 08/29/2021) 30 tablet 1   No current facility-administered medications on file prior to visit.   Past Medical History:  Diagnosis Date   Arthritis    Asthma    Diverticulitis    Heartburn    History of COVID-19    Kidney stones    Migraine    Past Surgical History:  Procedure Laterality Date   APPENDECTOMY  1985   disc fusion in neck  2022   [place]   ROTATOR CUFF REPAIR  2016   TOTAL ABDOMINAL HYSTERECTOMY  1984    Family History  Problem Relation Age of Onset   Alzheimer's disease Mother    Lupus Father    Lung cancer Sister    Leukemia Sister  Alzheimer's disease Sister    Alzheimer's disease Brother    Social History   Socioeconomic History   Marital status: Married    Spouse name: Not on file   Number of children: 2   Years of education: Not on file   Highest education level: Not on file  Occupational History   Not on file  Tobacco Use   Smoking status: Every Day    Packs/day: 0.50    Types: Cigarettes   Smokeless tobacco: Never  Vaping Use   Vaping Use: Never used  Substance and Sexual Activity   Alcohol use: Never   Drug use: Never   Sexual activity: Yes    Partners: Male  Other Topics Concern   Not on file  Social History Narrative   Not on file   Social Determinants of Health   Financial Resource Strain: Low Risk  (07/01/2021)   Overall Financial Resource Strain (CARDIA)    Difficulty of Paying Living  Expenses: Not hard at all  Food Insecurity: No Food Insecurity (07/01/2021)   Hunger Vital Sign    Worried About Running Out of Food in the Last Year: Never true    [place] in the Last Year: Never true  Transportation Needs: No Transportation Needs (07/01/2021)   PRAPARE - Hydrologist (Medical): No    Lack of Transportation (Non-Medical): No  Physical Activity: Inactive (07/01/2021)   Exercise Vital Sign    Days of Exercise per Week: 0 days    Minutes of Exercise per Session: 0 min  Stress: No Stress Concern Present (07/01/2021)   [place]    Feeling of Stress : Not at all  Social Connections: Moderately Integrated (07/01/2021)   Social Connection and Isolation Panel [NHANES]    Frequency of Communication with Friends and Family: Three times a week    Frequency of Social Gatherings with Friends and Family: Once a week    Attends Religious Services: More than 4 times per year    Active Member of [employer] or Organizations: No    Attends Archivist Meetings: Never    Marital Status: Married    Review of Systems  Constitutional:  Negative for chills, fatigue and fever.  HENT:  Negative for congestion, ear pain, rhinorrhea and sore throat.   Respiratory:  Negative for cough and shortness of breath.   Cardiovascular:  Negative for chest pain.  Gastrointestinal:  Negative for abdominal pain, constipation, diarrhea, nausea and vomiting.  Genitourinary:  Negative for dysuria and urgency.  Musculoskeletal:  Negative for back pain and myalgias.  Neurological:  Negative for dizziness, weakness, light-headedness and headaches.  Psychiatric/Behavioral:  Negative for dysphoric mood. The patient is not nervous/anxious.      Objective:  BP 130/74   Pulse 74   Temp (!) 96 F (35.6 C)   Ht _0  (1.499 m)   Wt 194 lb (88 kg)   SpO2 97%   BMI 39.18 kg/m      08/29/2021    8:56 AM  06/23/2021   10:38 AM 05/27/2021    1:54 PM  BP/Weight  Systolic BP 681 157 262  Diastolic BP 74 80 82  Wt. (Lbs) 194 196 197.2  BMI 39.18 kg/m2 39.59 kg/m2 39.83 kg/m2    Physical Exam Vitals reviewed.  Constitutional:      Appearance: Normal appearance. She is normal weight.  Neck:     Vascular: No carotid bruit.  Cardiovascular:     Rate and Rhythm: Normal rate and regular rhythm.     Heart sounds: Normal heart sounds.  Pulmonary:     Effort: Pulmonary effort is normal. No respiratory distress.     Breath sounds: Normal breath sounds.  Abdominal:     General: Abdomen is flat. Bowel sounds are normal.     Palpations: Abdomen is soft.     Tenderness: There is no abdominal tenderness.  Neurological:     Mental Status: She is alert and oriented to person, place, and time.  Psychiatric:        Mood and Affect: Mood normal.        Behavior: Behavior normal.      Lab Results  Component Value Date   WBC 12.6 (H) 05/28/2021   HGB 15.4 05/28/2021   HCT 45.0 05/28/2021   PLT 330 05/28/2021   GLUCOSE 205 (H) 05/28/2021   CHOL 190 05/28/2021   TRIG 133 05/28/2021   HDL 41 05/28/2021   LDLCALC 125 (H) 05/28/2021   ALT 18 05/28/2021   AST 15 05/28/2021   NA 142 05/28/2021   K 4.5 05/28/2021   CL 106 05/28/2021   CREATININE 0.88 05/28/2021   BUN 16 05/28/2021   CO2 23 05/28/2021   HGBA1C 6.7 (H) 06/23/2021      Assessment & Plan:   1. Type 2 diabetes mellitus with hyperglycemia, without long-term current use of insulin (HCC)-not at goal - Lipid panel - Comprehensive metabolic panel - CBC with Differential/Platelet - Ambulatory referral to diabetic education -pt declined treatment with Metformin  2. Hyperlipidemia associated with type 2 diabetes mellitus (HCC)-not at goal - Lipid panel - Comprehensive metabolic panel - CBC with Differential/Platelet - Ambulatory referral to diabetic education -heart healthy diet  3. Pulsatile tinnitus of both ears - US  Carotid Bilateral  4. Atherosclerotic cardiovascular disease - Lipid panel - Comprehensive metabolic panel - CBC with Differential/Platelet - US Carotid Bilateral - Ambulatory referral to diabetic education -smoking cessation recommended  5. Cigarette nicotine dependence with other nicotine-induced disorder - Lipid panel - Comprehensive metabolic panel - CBC with Differential/Platelet - US Carotid Bilateral -recommend smoking cessation  6. Vaccination declined  7. Statin declined        We will call you with lab results, carotid ultrasound, diabetes education referral Continue medications Follow-up in 31-month, fasting  Follow-up: 379-monthfasting  An After Visit Summary was printed and given to the patient.  I, ShRip HarbourNP, have reviewed all documentation for this visit. The documentation on 08/29/21 for the exam, diagnosis, procedures, and orders are all accurate and complete.   ShRip HarbourNP [phone]

## 2021-08-29 ENCOUNTER — Ambulatory Visit (INDEPENDENT_AMBULATORY_CARE_PROVIDER_SITE_OTHER): Payer: Medicare HMO | Admitting: Nurse Practitioner

## 2021-08-29 ENCOUNTER — Encounter: Payer: Self-pay | Admitting: Nurse Practitioner

## 2021-08-29 VITALS — BP 130/74 | HR 74 | Temp 96.0°F | Ht 59.0 in | Wt 194.0 lb

## 2021-08-29 DIAGNOSIS — F17218 Nicotine dependence, cigarettes, with other nicotine-induced disorders: Secondary | ICD-10-CM

## 2021-08-29 DIAGNOSIS — H93A3 Pulsatile tinnitus, bilateral: Secondary | ICD-10-CM | POA: Diagnosis not present

## 2021-08-29 DIAGNOSIS — Z5320 Procedure and treatment not carried out because of patient's decision for unspecified reasons: Secondary | ICD-10-CM

## 2021-08-29 DIAGNOSIS — E1169 Type 2 diabetes mellitus with other specified complication: Secondary | ICD-10-CM

## 2021-08-29 DIAGNOSIS — E1165 Type 2 diabetes mellitus with hyperglycemia: Secondary | ICD-10-CM

## 2021-08-29 DIAGNOSIS — I2510 Atherosclerotic heart disease of native coronary artery without angina pectoris: Secondary | ICD-10-CM

## 2021-08-29 DIAGNOSIS — Z2821 Immunization not carried out because of patient refusal: Secondary | ICD-10-CM | POA: Diagnosis not present

## 2021-08-29 DIAGNOSIS — R69 Illness, unspecified: Secondary | ICD-10-CM | POA: Diagnosis not present

## 2021-08-29 DIAGNOSIS — E785 Hyperlipidemia, unspecified: Secondary | ICD-10-CM

## 2021-08-29 DIAGNOSIS — E118 Type 2 diabetes mellitus with unspecified complications: Secondary | ICD-10-CM | POA: Diagnosis not present

## 2021-08-29 NOTE — Patient Instructions (Addendum)
We will call you with lab results, carotid ultrasound, diabetes education referral Continue medications Follow-up in 27-month, fasting Atherosclerosis  Atherosclerosis is when plaque builds up in the arteries. This causes narrowing and hardening of the arteries. Arteries are blood vessels that carry blood from the heart to all parts of the body. This blood contains oxygen. Plaque occurs due to inflammation or from a buildup of fat, cholesterol, calcium, waste products of cells, and a clotting material in the blood (fibrin). Plaque decreases the amount of blood that can flow through the artery. Atherosclerosis can affect any artery in your body, including: Heart arteries. Damage to these arteries may lead to coronary artery disease, which can cause a heart attack. Brain arteries. Damage to these arteries may cause a stroke. Leg, arm, and pelvis arteries. Peripheral artery disease (PAD) may result from damage to these arteries. Kidney arteries. Kidney (renal) failure may result from damage to kidney arteries. Treatment may slow the disease and prevent further damage to your heart, brain, peripheral arteries, and kidneys. What are the causes? This condition develops slowly over many years. The inner layers of your arteries become damaged and allow the gradual buildup of plaque. The exact cause of atherosclerosis is not fully understood. Symptoms of atherosclerosis do not occur until an artery becomes narrow or blocked. What increases the risk? The following factors may make you more likely to develop this condition: Being middle-aged or older. Certain medical conditions, including: High blood pressure. High cholesterol. High blood fats (triglycerides). Diabetes. Sleep apnea. Obesity. Certain lab levels, including: Elevated C-reactive protein (CRP). This is a sign of increased inflammation in your body. Elevated homocysteine levels. This is an amino acid that is associated with heart and blood  vessel disease. Using tobacco or nicotine products. A family history of atherosclerosis. Not exercising enough (sedentary lifestyle). Being stressed. Drinking too much alcohol or using drugs, such as cocaine or methamphetamine. What are the signs or symptoms? Symptoms of atherosclerosis do not occur until the plaque severely narrows or blocks the artery, which decreases blood flow. Sometimes, atherosclerosis does not cause symptoms. Symptoms of this condition include: Coronary artery disease. This may cause chest pain and shortness of breath. Decreased blood supply to your brain, which may cause a stroke. Signs of a stroke may include sudden: Weakness or numbness in your face, arm, or leg, especially on one side of your body. Trouble walking or difficulty moving your arms or legs. Loss of balance or coordination. Confusion. Slurred speech. Trouble speaking, or trouble understanding speech, or both (aphasia). Vision changes in one or both eyes. This may be double vision, blurred vision, or loss of vision. Severe headache with no known cause. The headache is often described as the worst headache ever experienced. PAD, which may cause pain, numbness, or nonhealing wounds, often in your legs and hips. Renal failure. This may cause tiredness, problems with urination, swelling, and itchy skin. How is this diagnosed? This condition is diagnosed based on your medical history and a physical exam. During the exam, your health care provider will: Check your pulse in different places. Listen for a "whooshing" sound over your arteries (bruit). You may also have tests, such as: Blood tests to check your levels of cholesterol, triglycerides, blood sugar, and CRP. Ankle-brachial index to compare blood pressure in your arms to blood pressure in your ankles to see how your blood is flowing. Heart (cardiac) tests. Electrocardiogram (ECG) to check for heart damage. Stress test to see how your heart reacts  to exercise.  Ultrasound tests. Ultrasound of your peripheral arteries to check blood flow. Echocardiogram to get images of your heart's chambers and valves. X-ray tests. Chest X-ray to see if you have an enlarged heart, which is a sign of heart failure. CT scan to check for damage to your heart, brain, or arteries. Angiogram. This is a test where dye is injected and X-rays are used to see the blood flow in the arteries. How is this treated? This condition is treated with lifestyle changes as the first step. These may include: Changing your diet. Losing weight. Reducing stress. Exercising and being physically active more regularly. Quitting smoking. You may also need medicine to: Lower triglycerides and cholesterol. Control blood pressure. Prevent blood clots. Lower inflammation in your body. Control your blood sugar. Sometimes, surgery is needed to: Remove plaque from an artery (endarterectomy). Open or widen a narrowed heart artery or peripheral artery (angioplasty). Create a new path for your blood with one of these procedures: Heart (coronary) artery bypass graft surgery. Peripheral artery bypass graft surgery. Place a small mesh tube (stent) in an artery to open or widen a narrowed artery. Follow these instructions at home: Eating and drinking  Eat a heart-healthy diet. Talk with your health care provider or a dietitian if you need help. A heart-healthy diet involves: Limiting unhealthy fats and increasing healthy fats. Some examples of healthy fats are avocados and olive oil. Eating plant-based foods, such as fruits, vegetables, nuts, whole grains, and legumes (such as peas and lentils). If you drink alcohol: Limit how much you have to: 0-1 drink a day for women who are not pregnant. 0-2 drinks a day for men. Know how much alcohol is in a drink. In the [place] one drink equals one 12 oz bottle of beer (355 mL), one 5 oz glass of wine (148 mL), or one 1 oz glass of hard  liquor (44 mL). Lifestyle  Maintain a healthy weight. Lose weight if your health care provider says that you need to do that. Follow an exercise program as told by your health care provider. Do not use any products that contain nicotine or tobacco. These products include cigarettes, chewing tobacco, and vaping devices, such as e-cigarettes. If you need help quitting, ask your health care provider. Do not use drugs. General instructions Take over-the-counter and prescription medicines only as told by your health care provider. Manage other health conditions as told. Keep all follow-up visits. This is important. Contact a health care provider if you have: An irregular heartbeat. Unexplained tiredness (fatigue). Trouble urinating, or you are producing less urine or foamy urine. Swelling of your hands or feet, or itchy skin. Unexplained pain or numbness in your legs or hips. A wound that is slow to heal or is not healing. Get help right away if: You have any symptoms of a heart attack. These may be: Chest pain. This includes squeezing chest pain that may feel like indigestion (angina). Shortness of breath. Pain in your neck, jaw, arms, back, or stomach. Cold sweat. Nausea. Light-headedness. Sudden pain, numbness, or coldness in a limb. You have any symptoms of a stroke. "BE FAST" is an easy way to remember the main warning signs of a stroke: B - Balance. Signs are dizziness, sudden trouble walking, or loss of balance. E - Eyes. Signs are trouble seeing or a sudden change in vision. F - Face. Signs are sudden weakness or numbness of the face, or the face or eyelid drooping on one side. A - Arms. Signs are  weakness or numbness in an arm. This happens suddenly and usually on one side of the body. S - Speech. Signs are sudden trouble speaking, slurred speech, or trouble understanding what people say. T - Time. Time to call emergency services. Write down what time symptoms started. You have  other signs of a stroke, such as: A sudden, severe headache with no known cause. Nausea or vomiting. Seizure. These symptoms may represent a serious problem that is an emergency. Do not wait to see if the symptoms will go away. Get medical help right away. Call your local emergency services (911 in the [place] Do not drive yourself to the hospital. Summary Atherosclerosis is when plaque builds up in the arteries and causes narrowing and hardening of the arteries. Plaque occurs due to inflammation or from a buildup of fat, cholesterol, calcium, cellular waste products, and fibrin. This condition may not cause any symptoms. Symptoms of atherosclerosis do not occur until the plaque severely narrows or blocks the artery. Treatment starts with lifestyle changes and may include medicines. In some cases, surgery is needed. Get help right away if you have any symptoms of a heart attack or stroke. This information is not intended to replace advice given to you by your health care provider. Make sure you discuss any questions you have with your health care provider. Document Revised: 05/08/2020 Document Reviewed: 05/08/2020 Elsevier Patient Education  [employer].  Diabetes Mellitus and Nutrition, Adult When you have diabetes, or diabetes mellitus, it is very important to have healthy eating habits because your blood sugar (glucose) levels are greatly affected by what you eat and drink. Eating healthy foods in the right amounts, at about the same times every day, can help you: Manage your blood glucose. Lower your risk of heart disease. Improve your blood pressure. Reach or maintain a healthy weight. What can affect my meal plan? Every person with diabetes is different, and each person has different needs for a meal plan. Your health care provider may recommend that you work with a dietitian to make a meal plan that is best for you. Your meal plan may vary depending on factors such as: The  calories you need. The medicines you take. Your weight. Your blood glucose, blood pressure, and cholesterol levels. Your activity level. Other health conditions you have, such as heart or kidney disease. How do carbohydrates affect me? Carbohydrates, also called carbs, affect your blood glucose level more than any other type of food. Eating carbs raises the amount of glucose in your blood. It is important to know how many carbs you can safely have in each meal. This is different for every person. Your dietitian can help you calculate how many carbs you should have at each meal and for each snack. How does alcohol affect me? Alcohol can cause a decrease in blood glucose (hypoglycemia), especially if you use insulin or take certain diabetes medicines by mouth. Hypoglycemia can be a life-threatening condition. Symptoms of hypoglycemia, such as sleepiness, dizziness, and confusion, are similar to symptoms of having too much alcohol. Do not drink alcohol if: Your health care provider tells you not to drink. You are pregnant, may be pregnant, or are planning to become pregnant. If you drink alcohol: Limit how much you have to: 0-1 drink a day for women. 0-2 drinks a day for men. Know how much alcohol is in your drink. In the [place] one drink equals one 12 oz bottle of beer (355 mL), one 5 oz glass of wine (  148 mL), or one 1 oz glass of hard liquor (44 mL). Keep yourself hydrated with water, diet soda, or unsweetened iced tea. Keep in mind that regular soda, juice, and other mixers may contain a lot of sugar and must be counted as carbs. What are tips for following this plan?  Reading food labels Start by checking the serving size on the Nutrition Facts label of packaged foods and drinks. The number of calories and the amount of carbs, fats, and other nutrients listed on the label are based on one serving of the item. Many items contain more than one serving per package. Check the total grams (g) of  carbs in one serving. Check the number of grams of saturated fats and trans fats in one serving. Choose foods that have a low amount or none of these fats. Check the number of milligrams (mg) of salt (sodium) in one serving. Most people should limit total sodium intake to less than 2,300 mg per day. Always check the nutrition information of foods labeled as "low-fat" or "nonfat." These foods may be higher in added sugar or refined carbs and should be avoided. Talk to your dietitian to identify your daily goals for nutrients listed on the label. Shopping Avoid buying canned, pre-made, or processed foods. These foods tend to be high in fat, sodium, and added sugar. Shop around the outside edge of the grocery store. This is where you will most often find fresh fruits and vegetables, bulk grains, fresh meats, and fresh dairy products. Cooking Use low-heat cooking methods, such as baking, instead of high-heat cooking methods, such as deep frying. Cook using healthy oils, such as olive, canola, or sunflower oil. Avoid cooking with butter, cream, or high-fat meats. Meal planning Eat meals and snacks regularly, preferably at the same times every day. Avoid going long periods of time without eating. Eat foods that are high in fiber, such as fresh fruits, vegetables, beans, and whole grains. Eat 4-6 oz (112-168 g) of lean protein each day, such as lean meat, chicken, fish, eggs, or tofu. One ounce (oz) (28 g) of lean protein is equal to: 1 oz (28 g) of meat, chicken, or fish. 1 egg.  cup (62 g) of tofu. Eat some foods each day that contain healthy fats, such as avocado, nuts, seeds, and fish. What foods should I eat? Fruits Berries. Apples. Oranges. Peaches. Apricots. Plums. Grapes. Mangoes. Papayas. Pomegranates. Kiwi. Cherries. Vegetables Leafy greens, including lettuce, spinach, kale, chard, collard greens, mustard greens, and cabbage. Beets. Cauliflower. Broccoli. Carrots. Green beans. Tomatoes.  Peppers. Onions. Cucumbers. Brussels sprouts. Grains Whole grains, such as whole-wheat or whole-grain bread, crackers, tortillas, cereal, and pasta. Unsweetened oatmeal. Quinoa. Brown or wild rice. Meats and other proteins Seafood. Poultry without skin. Lean cuts of poultry and beef. Tofu. Nuts. Seeds. Dairy Low-fat or fat-free dairy products such as milk, yogurt, and cheese. The items listed above may not be a complete list of foods and beverages you can eat and drink. Contact a dietitian for more information. What foods should I avoid? Fruits Fruits canned with syrup. Vegetables Canned vegetables. Frozen vegetables with butter or cream sauce. Grains Refined white flour and flour products such as bread, pasta, snack foods, and cereals. Avoid all processed foods. Meats and other proteins Fatty cuts of meat. Poultry with skin. Breaded or fried meats. Processed meat. Avoid saturated fats. Dairy Full-fat yogurt, cheese, or milk. Beverages Sweetened drinks, such as soda or iced tea. The items listed above may not be a complete  list of foods and beverages you should avoid. Contact a dietitian for more information. Questions to ask a health care provider Do I need to meet with a certified diabetes care and education specialist? Do I need to meet with a dietitian? What number can I call if I have questions? When are the best times to check my blood glucose? Where to find more information: American Diabetes Association: diabetes.org Academy of Nutrition and Dietetics: eatright.[employer] of Diabetes and Digestive and Kidney Diseases: [employer] Association of Diabetes Care & Education Specialists: diabeteseducator.org Summary It is important to have healthy eating habits because your blood sugar (glucose) levels are greatly affected by what you eat and drink. It is important to use alcohol carefully. A healthy meal plan will help you manage your blood glucose and lower your  risk of heart disease. Your health care provider may recommend that you work with a dietitian to make a meal plan that is best for you. This information is not intended to replace advice given to you by your health care provider. Make sure you discuss any questions you have with your health care provider. Document Revised: 09/06/2019 Document Reviewed: 09/06/2019 Elsevier Patient Education  [employer].

## 2021-08-30 LAB — CBC WITH DIFFERENTIAL/PLATELET
Basophils Absolute: 0.1 10*3/uL (ref 0.0–0.2)
Basos: 1 %
EOS (ABSOLUTE): 0.2 10*3/uL (ref 0.0–0.4)
Eos: 2 %
Hematocrit: 44.9 % (ref 34.0–46.6)
Hemoglobin: 15.3 g/dL (ref 11.1–15.9)
Immature Grans (Abs): 0.1 10*3/uL (ref 0.0–0.1)
Immature Granulocytes: 1 %
Lymphocytes Absolute: 2.5 10*3/uL (ref 0.7–3.1)
Lymphs: 25 %
MCH: 31.9 pg (ref 26.6–33.0)
MCHC: 34.1 g/dL (ref 31.5–35.7)
MCV: 94 fL (ref 79–97)
Monocytes Absolute: 0.8 10*3/uL (ref 0.1–0.9)
Monocytes: 8 %
Neutrophils Absolute: 6.4 10*3/uL (ref 1.4–7.0)
Neutrophils: 63 %
Platelets: 283 10*3/uL (ref 150–450)
RBC: 4.79 x10E6/uL (ref 3.77–5.28)
RDW: 12.3 % (ref 11.7–15.4)
WBC: 10.1 10*3/uL (ref 3.4–10.8)

## 2021-08-30 LAB — COMPREHENSIVE METABOLIC PANEL
ALT: 16 IU/L (ref 0–32)
AST: 19 IU/L (ref 0–40)
Albumin/Globulin Ratio: 1.3 (ref 1.2–2.2)
Albumin: 4 g/dL (ref 3.8–4.9)
Alkaline Phosphatase: 68 IU/L (ref 44–121)
BUN/Creatinine Ratio: 13 (ref 9–23)
BUN: 12 mg/dL (ref 6–24)
Bilirubin Total: 0.4 mg/dL (ref 0.0–1.2)
CO2: 20 mmol/L (ref 20–29)
Calcium: 9 mg/dL (ref 8.7–10.2)
Chloride: 106 mmol/L (ref 96–106)
Creatinine, Ser: 0.89 mg/dL (ref 0.57–1.00)
Globulin, Total: 3.1 g/dL (ref 1.5–4.5)
Glucose: 98 mg/dL (ref 70–99)
Potassium: 4.5 mmol/L (ref 3.5–5.2)
Sodium: 141 mmol/L (ref 134–144)
Total Protein: 7.1 g/dL (ref 6.0–8.5)
eGFR: 75 mL/min/{1.73_m2} (ref >59)

## 2021-08-30 LAB — LIPID PANEL
Chol/HDL Ratio: 4.8 ratio — ABNORMAL HIGH (ref 0.0–4.4)
Cholesterol, Total: 196 mg/dL (ref 100–199)
HDL: 41 mg/dL (ref >39)
LDL Chol Calc (NIH): 127 mg/dL — ABNORMAL HIGH (ref 0–99)
Triglycerides: 156 mg/dL — ABNORMAL HIGH (ref 0–149)
VLDL Cholesterol Cal: 28 mg/dL (ref 5–40)

## 2021-08-30 LAB — CARDIOVASCULAR RISK ASSESSMENT

## 2021-09-26 LAB — FECAL OCCULT BLOOD, IMMUNOCHEMICAL: IFOBT: NEGATIVE

## 2021-09-30 ENCOUNTER — Ambulatory Visit (INDEPENDENT_AMBULATORY_CARE_PROVIDER_SITE_OTHER): Payer: Medicare HMO | Admitting: Physician Assistant

## 2021-09-30 ENCOUNTER — Encounter: Payer: Self-pay | Admitting: Physician Assistant

## 2021-09-30 VITALS — BP 130/80 | HR 73 | Temp 97.3°F | Ht 59.0 in | Wt 191.2 lb

## 2021-09-30 DIAGNOSIS — J0100 Acute maxillary sinusitis, unspecified: Secondary | ICD-10-CM

## 2021-09-30 DIAGNOSIS — R42 Dizziness and giddiness: Secondary | ICD-10-CM | POA: Diagnosis not present

## 2021-09-30 MED ORDER — CLINDAMYCIN HCL 300 MG PO CAPS: 300.0000 mg | ORAL_CAPSULE | Freq: Three times a day (TID) | ORAL | 0 refills | Status: DC

## 2021-09-30 MED ORDER — FLUTICASONE PROPIONATE 50 MCG/ACT NA SUSP: 2.0000 | Freq: Every day | NASAL | 6 refills | Status: DC

## 2021-09-30 MED ORDER — MECLIZINE HCL 25 MG PO TABS
25.0000 mg | ORAL_TABLET | Freq: Three times a day (TID) | ORAL | 0 refills | Status: DC | PRN
Start: 2021-09-30 — End: 2021-12-26

## 2021-09-30 NOTE — Progress Notes (Signed)
Acute Office Visit  Subjective:    Patient ID: Sherry Montgomery, female    DOB: 04/23/1960, 61 y.o.   MRN: [id number]  Chief Complaint  Patient presents with   Dizziness    HPI: Patient is in today for complaints of intermittent dizziness Pt states that for the past 2 months she has had a few episodes of where she feels 'swimmy headed' - one episode in particular was just walking down hall and walls were 'moving' One episode brushing teeth and looked up quickly and felt dizzy States all the time she rides in cars she can get similar symptoms Says episodes last 3-4 minutes She denies chest pain, shortness of breath, palpitations or tachycardia She denies vision symptoms - no headaches She does have history of recurrent similar symptoms ever since having bilateral tubes placed in ears 4/22 She admits for the past 3 weeks having thick clear sinus drainage and also bloody nasal drainage - she has had a productive cough as well This morning noted fluid in left ear canal  Pt recently had cmp and cbc - results stable  Past Medical History:  Diagnosis Date   Arthritis    Asthma    Diverticulitis    Heartburn    History of COVID-19    Kidney stones    Migraine     Past Surgical History:  Procedure Laterality Date   APPENDECTOMY  1985   disc fusion in neck  2022   [place]  2016   TOTAL ABDOMINAL HYSTERECTOMY  1984    Family History  Problem Relation Age of Onset   Alzheimer's disease Mother    Lupus Father    Lung cancer Sister    Leukemia Sister    Alzheimer's disease Sister    Alzheimer's disease Brother     Social History   Socioeconomic History   Marital status: Married    Spouse name: Not on file   Number of children: 2   Years of education: Not on file   Highest education level: Not on file  Occupational History   Not on file  Tobacco Use   Smoking status: Every Day    Packs/day: 0.50     Types: Cigarettes   Smokeless tobacco: Never  Vaping Use   Vaping Use: Never used  Substance and Sexual Activity   Alcohol use: Never   Drug use: Never   Sexual activity: Yes    Partners: Male  Other Topics Concern   Not on file  Social History Narrative   Not on file   Social Determinants of Health   Financial Resource Strain: Low Risk  (07/01/2021)   Overall Financial Resource Strain (CARDIA)    Difficulty of Paying Living Expenses: Not hard at all  Food Insecurity: No Food Insecurity (07/01/2021)   Hunger Vital Sign    Worried About Running Out of Food in the Last Year: Never true    Ran Out of Food in the Last Year: Never true  Transportation Needs: No Transportation Needs (07/01/2021)   PRAPARE - Hydrologist (Medical): No    Lack of Transportation (Non-Medical): No  Physical Activity: Inactive (07/01/2021)   Exercise Vital Sign    Days of Exercise per Week: 0 days    Minutes of Exercise per Session: 0 min  Stress: No Stress Concern Present (07/01/2021)   [place]  Questionnaire    Feeling of Stress : Not at all  Social Connections: Moderately Integrated (07/01/2021)   Social Connection and Isolation Panel [NHANES]    Frequency of Communication with Friends and Family: Three times a week    Frequency of Social Gatherings with Friends and Family: Once a week    Attends Religious Services: More than 4 times per year    Active Member of [employer] or Organizations: No    Attends Archivist Meetings: Never    Marital Status: Married  Human resources officer Violence: Not At Risk (07/01/2021)   Humiliation, Afraid, Rape, and Kick questionnaire    Fear of Current or Ex-Partner: No    Emotionally Abused: No    Physically Abused: No    Sexually Abused: No    Outpatient Medications Prior to Visit  Medication Sig Dispense Refill   alendronate (FOSAMAX) 70 MG tablet Take 1 tablet (70 mg total) by  mouth once a week. Take with a full glass of water on an empty stomach. 4 tablet 11   diazepam (VALIUM) 2 MG tablet Take 1 tablet (2 mg total) by mouth every 6 (six) hours as needed. 30 tablet 3   Naproxen Sodium (ALEVE PO) Take 1 tablet by mouth daily as needed.     No facility-administered medications prior to visit.    Allergies  Allergen Reactions   Codeine Anaphylaxis    Throat swelling Throat swelling Throat swelling    Shellfish Allergy Anaphylaxis    Other reaction(s): Shock (ALLERGY) Other reaction(s): Shock (ALLERGY)    Ciprofloxacin Nausea And Vomiting   Erythromycin    Ibuprofen Other (See Comments)    Other reaction(s): Sweating (intolerance) Other reaction(s): Sweating (intolerance)    Silver Nitrate Swelling   Sulfa Antibiotics    Tramadol Other (See Comments)    ask ask ask    Tramadol Hcl    Acetaminophen Rash   Ampicillin Rash   Cefdinir Rash   Cephalexin Rash   Doxycycline Rash   Fish-Derived Products Rash   Hydrocodone-Acetaminophen Nausea And Vomiting and Rash    ask ask ask    Sulfamethoxazole Rash    Review of Systems CONSTITUTIONAL: Negative for chills, fatigue, fever, unintentional weight gain and unintentional weight loss.  E/N/T:see HPI CARDIOVASCULAR: see HPI RESPIRATORY: Negative for recent cough and dyspnea.  GASTROINTESTINAL: Negative for abdominal pain,nausea and vomiting.   NEUROLOGICAL:see HPI         Objective:  PHYSICAL EXAM:   VS: BP 130/80 (BP Location: Left Arm, Patient Position: Sitting, Cuff Size: Normal)   Pulse 73   Temp (!) 97.3 F (36.3 C) (Temporal)   Ht 4' 11"  (1.499 m)   Wt 191 lb 3.2 oz (86.7 kg)   SpO2 99%   BMI 38.62 kg/m   GEN: Well nourished, well developed, in no acute distress  HEENT: tubes noted both ears - does have fluid from left tube draining Oropharynx - normal mucosa, palate, and posterior pharynx Neck: no JVD or masses - no thyromegaly- no bruits Cardiac: RRR; no murmurs, rubs, or  gallops,no edema -  Respiratory:  normal respiratory rate and pattern with no distress - normal breath sounds with no rales, rhonchi, wheezes or rubs  Skin: warm and dry, no rash  Neuro:  Alert and Oriented x 3, Strength and sensation are intact - CN II-Xii grossly intact Orthostatic Vitals for the past 48 hrs (Last 6 readings):  Patient Position Orthostatic BP Orthostatic Pulse  09/30/21 0926 Sitting -- --  09/30/21 2202  Supine 128/78 72  09/30/21 0928 Sitting 126/78 78  09/30/21 0929 Standing 110/80 75   EKG - normal    Health Maintenance Due  Topic Date Due   URINE MICROALBUMIN  Never done   PAP SMEAR-Modifier  Never done   INFLUENZA VACCINE  09/16/2021    There are no preventive care reminders to display for this patient.   No results found for: "TSH" Lab Results  Component Value Date   WBC 10.1 08/29/2021   HGB 15.3 08/29/2021   HCT 44.9 08/29/2021   MCV 94 08/29/2021   PLT 283 08/29/2021   Lab Results  Component Value Date   NA 141 08/29/2021   K 4.5 08/29/2021   CO2 20 08/29/2021   GLUCOSE 98 08/29/2021   BUN 12 08/29/2021   CREATININE 0.89 08/29/2021   BILITOT 0.4 08/29/2021   ALKPHOS 68 08/29/2021   AST 19 08/29/2021   ALT 16 08/29/2021   PROT 7.1 08/29/2021   ALBUMIN 4.0 08/29/2021   CALCIUM 9.0 08/29/2021   EGFR 75 08/29/2021   Lab Results  Component Value Date   CHOL 196 08/29/2021   Lab Results  Component Value Date   HDL 41 08/29/2021   Lab Results  Component Value Date   LDLCALC 127 (H) 08/29/2021   Lab Results  Component Value Date   TRIG 156 (H) 08/29/2021   Lab Results  Component Value Date   CHOLHDL 4.8 (H) 08/29/2021   Lab Results  Component Value Date   HGBA1C 6.7 (H) 06/23/2021       Assessment & Plan:   Problem List Items Addressed This Visit   None Visit Diagnoses     Dizziness    -  Primary   Relevant Medications   meclizine (ANTIVERT) 25 MG tablet   Other Relevant Orders   EKG 12-Lead   Acute  non-recurrent maxillary sinusitis       Relevant Medications   fluticasone (FLONASE) 50 MCG/ACT nasal spray   clindamycin (CLEOCIN) 300 MG capsule      Meds ordered this encounter  Medications   meclizine (ANTIVERT) 25 MG tablet    Sig: Take 1 tablet (25 mg total) by mouth 3 (three) times daily as needed for dizziness.    Dispense:  30 tablet    Refill:  0    Order Specific Question:   Supervising Provider    Answer:   COX, Lynder Parents   fluticasone (FLONASE) 50 MCG/ACT nasal spray    Sig: Place 2 sprays into both nostrils daily.    Dispense:  16 g    Refill:  6    Order Specific Question:   Supervising Provider    Answer:   Shelton Silvas   clindamycin (CLEOCIN) 300 MG capsule    Sig: Take 1 capsule (300 mg total) by mouth 3 (three) times daily.    Dispense:  21 capsule    Refill:  0    Order Specific Question:   Supervising Provider    AnswerShelton Silvas    Orders Placed This Encounter  Procedures   EKG 12-Lead     Follow-up: Return if symptoms worsen or fail to improve. RECOMMEND FURTHER EVALUATION IF SYMPTOMS PERSIST  An After Visit Summary was printed and given to the patient.  Yetta Flock [facility] [phone]

## 2021-10-06 ENCOUNTER — Telehealth: Payer: Self-pay

## 2021-10-06 NOTE — Telephone Encounter (Signed)
Patient called to let you know that her symptoms from last week are much better and she is not coughing so much and her anxiety was high yesterday and she felt dizzy. She took 1/2 valium and then she got better. She also wanted you know that she had CT Spine cervical wo contrast on 04 /26/2023 in [facility] if you want to see it.

## 2021-10-08 ENCOUNTER — Ambulatory Visit: Payer: Medicare HMO

## 2021-10-08 ENCOUNTER — Other Ambulatory Visit: Payer: Self-pay | Admitting: Nurse Practitioner

## 2021-10-08 DIAGNOSIS — E1165 Type 2 diabetes mellitus with hyperglycemia: Secondary | ICD-10-CM

## 2021-10-08 LAB — HEMOGLOBIN A1C
Est. average glucose Bld gHb Est-mCnc: 126 mg/dL
Hgb A1c MFr Bld: 6 % — ABNORMAL HIGH (ref 4.8–5.6)

## 2021-10-21 ENCOUNTER — Encounter: Payer: Self-pay | Admitting: Nurse Practitioner

## 2021-10-30 ENCOUNTER — Ambulatory Visit: Payer: Medicare HMO | Admitting: Physician Assistant

## 2021-11-03 ENCOUNTER — Ambulatory Visit: Payer: Medicare HMO | Admitting: Physician Assistant

## 2021-12-04 ENCOUNTER — Ambulatory Visit (INDEPENDENT_AMBULATORY_CARE_PROVIDER_SITE_OTHER): Payer: Medicare HMO | Admitting: Nurse Practitioner

## 2021-12-04 ENCOUNTER — Encounter: Payer: Self-pay | Admitting: Nurse Practitioner

## 2021-12-04 VITALS — BP 112/80 | HR 70 | Temp 95.6°F | Wt 187.4 lb

## 2021-12-04 DIAGNOSIS — I2510 Atherosclerotic heart disease of native coronary artery without angina pectoris: Secondary | ICD-10-CM

## 2021-12-04 DIAGNOSIS — R69 Illness, unspecified: Secondary | ICD-10-CM | POA: Diagnosis not present

## 2021-12-04 DIAGNOSIS — F419 Anxiety disorder, unspecified: Secondary | ICD-10-CM | POA: Diagnosis not present

## 2021-12-04 DIAGNOSIS — E1169 Type 2 diabetes mellitus with other specified complication: Secondary | ICD-10-CM | POA: Diagnosis not present

## 2021-12-04 DIAGNOSIS — F17219 Nicotine dependence, cigarettes, with unspecified nicotine-induced disorders: Secondary | ICD-10-CM

## 2021-12-04 DIAGNOSIS — E1165 Type 2 diabetes mellitus with hyperglycemia: Secondary | ICD-10-CM | POA: Diagnosis not present

## 2021-12-04 DIAGNOSIS — Z5320 Procedure and treatment not carried out because of patient's decision for unspecified reasons: Secondary | ICD-10-CM

## 2021-12-04 DIAGNOSIS — Z6837 Body mass index (BMI) 37.0-37.9, adult: Secondary | ICD-10-CM | POA: Diagnosis not present

## 2021-12-04 DIAGNOSIS — G8929 Other chronic pain: Secondary | ICD-10-CM | POA: Diagnosis not present

## 2021-12-04 DIAGNOSIS — M2550 Pain in unspecified joint: Secondary | ICD-10-CM | POA: Diagnosis not present

## 2021-12-04 DIAGNOSIS — E785 Hyperlipidemia, unspecified: Secondary | ICD-10-CM

## 2021-12-04 NOTE — Patient Instructions (Addendum)
We will call you with lab results Continue heart healthy diet and regular physical activity Follow-up in 52-month    Diabetes Mellitus and Exercise Exercising regularly is important for overall health, especially for people who have diabetes mellitus. Exercising is not only about losing weight. It has many other health benefits, such as increasing muscle strength and bone density and reducing body fat and stress. This leads to improved fitness, flexibility, and endurance, all of which result in better overall health. What are the benefits of exercise if I have diabetes? Exercise has many benefits for people with diabetes. They include: Helping to lower and control blood sugar (glucose). Helping the body to respond better to the hormone insulin by improving insulin sensitivity. Reducing how much insulin the body needs. Lowering the risk for heart disease by: Lowering "bad" cholesterol and triglyceride levels. Increasing "good" cholesterol levels. Lowering blood pressure. Lowering blood glucose levels. What is my activity plan? Your health care provider or certified diabetes educator can help you make a plan for the type and frequency of exercise that works for you. This is called your activity plan. Be sure to: Get at least 150 minutes of medium-intensity or high-intensity exercise each week. Exercises may include brisk walking, biking, or water aerobics. Do stretching and strengthening exercises, such as yoga or weight lifting, at least 2 times a week. Spread out your activity over at least 3 days of the week. Get some form of physical activity each day. Do not go more than 2 days in a row without some kind of physical activity. Avoid being inactive for more than 90 minutes at a time. Take frequent breaks to walk or stretch. Choose exercises or activities that you enjoy. Set realistic goals. Start slowly and gradually increase your exercise intensity over time. How do I manage my diabetes  during exercise?  Monitor your blood glucose Check your blood glucose before and after exercising. If your blood glucose is: 240 mg/dL (13.3 mmol/L) or higher before you exercise, check your urine for ketones. These are chemicals created by the liver. If you have ketones in your urine, do not exercise until your blood glucose returns to normal. 100 mg/dL (5.6 mmol/L) or lower, eat a snack containing 15-20 grams of carbohydrate. Check your blood glucose 15 minutes after the snack to make sure that your glucose level is above 100 mg/dL (5.6 mmol/L) before you start your exercise. Know the symptoms of low blood glucose (hypoglycemia) and how to treat it. Your risk for hypoglycemia increases during and after exercise. Follow these tips and your health care provider's instructions Keep a carbohydrate snack that is fast-acting for use before, during, and after exercise to help prevent or treat hypoglycemia. Avoid injecting insulin into areas of the body that are going to be exercised. For example, avoid injecting insulin into: Your arms, when you are about to play tennis. Your legs, when you are about to go jogging. Keep records of your exercise habits. Doing this can help you and your health care provider adjust your diabetes management plan as needed. Write down: Food that you eat before and after you exercise. Blood glucose levels before and after you exercise. The type and amount of exercise you have done. Work with your health care provider when you start a new exercise or activity. He or she may need to: Make sure that the activity is safe for you. Adjust your insulin, other medicines, and food that you eat. Drink plenty of water while you exercise. This prevents  loss of water (dehydration) and problems caused by a lot of heat in the body (heat stroke). Where to find more information American Diabetes Association: www.diabetes.org Summary Exercising regularly is important for overall health,  especially for people who have diabetes mellitus. Exercising has many health benefits. It increases muscle strength and bone density and reduces body fat and stress. It also lowers and controls blood glucose. Your health care provider or certified diabetes educator can help you make an activity plan for the type and frequency of exercise that works for you. Work with your health care provider to make sure any new activity is safe for you. Also work with your health care provider to adjust your insulin, other medicines, and the food you eat. This information is not intended to replace advice given to you by your health care provider. Make sure you discuss any questions you have with your health care provider. Document Revised: 10/31/2018 Document Reviewed: 10/31/2018 Elsevier Patient Education  [place].

## 2021-12-04 NOTE — Progress Notes (Signed)
Subjective:  Patient ID: Sherry Montgomery, female    DOB: 30-Jul-1960  Age: 61 y.o. MRN: [id number]  Chief Complaint  Patient presents with   Diabetes    HPI  Pt presents for follow-up of type 2 DM and hyperlipidemia. She has declined medications. She is a long-term cigarette smoker. She has chronic joint pain related to fibromyalgia. States she was previously prescribed Toradol for chronic pain. She is up-to-date on mammogram and colonoscopy.    Diabetes Mellitus Type II, Follow-up  Lab Results  Component Value Date   HGBA1C 6.0 (H) 10/08/2021   HGBA1C 6.7 (H) 06/23/2021   Wt Readings from Last 3 Encounters:  12/04/21 187 lb 6.4 oz (85 kg)  09/30/21 191 lb 3.2 oz (86.7 kg)  08/29/21 194 lb (88 kg)   Last seen for diabetes 3 months ago.  Management  includes diet modifications. She reports good compliance with treatment. Home blood sugar records:  99-134 Episodes of hypoglycemia? No  Current insulin regiment: none Most Recent Eye Exam: Dec 2022, scheduled for Dec 2023 Current exercise: walking Current diet habits: in general, a "healthy" diet    Pertinent Labs: Lab Results  Component Value Date   CHOL 196 08/29/2021   HDL 41 08/29/2021   LDLCALC 127 (H) 08/29/2021   TRIG 156 (H) 08/29/2021   CHOLHDL 4.8 (H) 08/29/2021   Lab Results  Component Value Date   NA 141 08/29/2021   K 4.5 08/29/2021   CREATININE 0.89 08/29/2021   EGFR 75 08/29/2021   GLUCOSE 98 08/29/2021     Lipid/Cholesterol, Follow-up  Last lipid panel Other pertinent labs  Lab Results  Component Value Date   CHOL 196 08/29/2021   HDL 41 08/29/2021   LDLCALC 127 (H) 08/29/2021   TRIG 156 (H) 08/29/2021   CHOLHDL 4.8 (H) 08/29/2021   Lab Results  Component Value Date   ALT 16 08/29/2021   AST 19 08/29/2021   PLT 283 08/29/2021     She was last seen for this 3 months ago.  Management includes diet modification and physical activity     She reports good compliance with treatment. She  is not having side effects.      Current diet: in general, a "healthy" diet   Current exercise: walking  The 10-year ASCVD risk score (Arnett DK, et al., 2019) is: 12%   Anxiety, Follow-up  She was last seen for anxiety 3 months ago. Current treatment includes continue Valium as prescribed.   She reports good compliance with treatment. She reports good tolerance of treatment. She is not having side effects.   She feels her anxiety is mild and Improved since last visit.   GAD-7 Results    12/04/2021    7:57 AM 05/27/2021    2:02 PM  GAD-7 Generalized Anxiety Disorder Screening Tool  1. Feeling Nervous, Anxious, or on Edge 0 3  2. Not Being Able to Stop or Control Worrying 0 0  3. Worrying Too Much About Different Things 0 0  4. Trouble Relaxing 0 1  5. Being So Restless it's Hard To Sit Still 0 1  6. Becoming Easily Annoyed or Irritable 1 3  7. Feeling Afraid As If Something Awful Might Happen 0 1  Total GAD-7 Score 1 9  Difficulty At Work, Home, or Getting  Along With Others? Not difficult at all     PHQ-9 Scores    08/29/2021    9:06 AM 07/01/2021    2:10 PM 05/27/2021  1:58 PM  PHQ9 SCORE ONLY  PHQ-9 Total Score 0 0 0      Current Outpatient Medications on File Prior to Visit  Medication Sig Dispense Refill   alendronate (FOSAMAX) 70 MG tablet Take 1 tablet (70 mg total) by mouth once a week. Take with a full glass of water on an empty stomach. 4 tablet 11   clindamycin (CLEOCIN) 300 MG capsule Take 1 capsule (300 mg total) by mouth 3 (three) times daily. 21 capsule 0   diazepam (VALIUM) 2 MG tablet Take 1 tablet (2 mg total) by mouth every 6 (six) hours as needed. 30 tablet 3   fluticasone (FLONASE) 50 MCG/ACT nasal spray Place 2 sprays into both nostrils daily. 16 g 6   meclizine (ANTIVERT) 25 MG tablet Take 1 tablet (25 mg total) by mouth 3 (three) times daily as needed for dizziness. 30 tablet 0   Naproxen Sodium (ALEVE PO) Take 1 tablet by mouth daily as  needed.     No current facility-administered medications on file prior to visit.   Past Medical History:  Diagnosis Date   Arthritis    Asthma    Diverticulitis    Heartburn    History of COVID-19    Kidney stones    Migraine    Past Surgical History:  Procedure Laterality Date   APPENDECTOMY  1985   disc fusion in neck  2022   [place]   ROTATOR CUFF REPAIR  2016   TOTAL ABDOMINAL HYSTERECTOMY  1984    Family History  Problem Relation Age of Onset   Alzheimer's disease Mother    Lupus Father    Lung cancer Sister    Leukemia Sister    Alzheimer's disease Sister    Alzheimer's disease Brother    Social History   Socioeconomic History   Marital status: Married    Spouse name: Not on file   Number of children: 2   Years of education: Not on file   Highest education level: Not on file  Occupational History   Not on file  Tobacco Use   Smoking status: Every Day    Packs/day: 0.50    Types: Cigarettes   Smokeless tobacco: Never  Vaping Use   Vaping Use: Never used  Substance and Sexual Activity   Alcohol use: Never   Drug use: Never   Sexual activity: Yes    Partners: Male  Other Topics Concern   Not on file  Social History Narrative   Not on file   Social Determinants of Health   Financial Resource Strain: Low Risk  (07/01/2021)   Overall Financial Resource Strain (CARDIA)    Difficulty of Paying Living Expenses: Not hard at all  Food Insecurity: No Food Insecurity (07/01/2021)   Hunger Vital Sign    Worried About Running Out of Food in the Last Year: Never true    [place] in the Last Year: Never true  Transportation Needs: No Transportation Needs (07/01/2021)   PRAPARE - Hydrologist (Medical): No    Lack of Transportation (Non-Medical): No  Physical Activity: Inactive (07/01/2021)   Exercise Vital Sign    Days of Exercise per Week: 0 days    Minutes of Exercise per Session:  0 min  Stress: No Stress Concern Present (07/01/2021)   [place]    Feeling of Stress : Not at  all  Social Connections: Moderately Integrated (07/01/2021)   Social Connection and Isolation Panel [NHANES]    Frequency of Communication with Friends and Family: Three times a week    Frequency of Social Gatherings with Friends and Family: Once a week    Attends Religious Services: More than 4 times per year    Active Member of [employer] or Organizations: No    Attends Archivist Meetings: Never    Marital Status: Married    Review of Systems  Constitutional:  Negative for fatigue.  HENT:  Positive for postnasal drip. Negative for congestion, ear pain and sore throat.   Respiratory:  Negative for cough and shortness of breath.   Cardiovascular:  Negative for chest pain.  Gastrointestinal:  Negative for abdominal pain, constipation, diarrhea, nausea and vomiting.  Genitourinary:  Negative for dysuria, frequency and urgency.  Musculoskeletal:  Positive for arthralgias and myalgias. Negative for back pain.       Chronic joint and muscle pain from fibromyalgia  Allergic/Immunologic: Positive for environmental allergies.  Neurological:  Negative for dizziness and headaches.  Hematological:  Bruises/bleeds easily.  Psychiatric/Behavioral:  Negative for agitation and sleep disturbance. The patient is not nervous/anxious.      Objective:  BP 112/80 (BP Location: Left Arm, Patient Position: Sitting, Cuff Size: Large)   Pulse 70   Temp (!) 95.6 F (35.3 C) (Temporal)   Wt 187 lb 6.4 oz (85 kg)   SpO2 98%   BMI 37.85 kg/m      12/04/2021    7:34 AM 09/30/2021    9:26 AM 08/29/2021    8:56 AM  BP/Weight  Systolic BP [phone]  Diastolic BP 80 80 74  Wt. (Lbs) 187.4 191.2 194  BMI 37.85 kg/m2 38.62 kg/m2 39.18 kg/m2    Physical Exam Vitals reviewed.  Constitutional:      Appearance: Normal appearance.  HENT:      Right Ear: A PE tube is present.     Left Ear: A PE tube is present.     Mouth/Throat:     Pharynx: Posterior oropharyngeal erythema present.  Neck:     Vascular: No carotid bruit.  Cardiovascular:     Rate and Rhythm: Normal rate and regular rhythm.     Heart sounds: Normal heart sounds.  Pulmonary:     Effort: Pulmonary effort is normal.     Breath sounds: Normal breath sounds.  Abdominal:     General: Abdomen is flat. Bowel sounds are normal.     Palpations: Abdomen is soft.     Tenderness: There is no abdominal tenderness.  Neurological:     Mental Status: She is alert and oriented to person, place, and time.  Psychiatric:        Mood and Affect: Mood normal.        Behavior: Behavior normal.    Lab Results  Component Value Date   WBC 10.1 08/29/2021   HGB 15.3 08/29/2021   HCT 44.9 08/29/2021   PLT 283 08/29/2021   GLUCOSE 98 08/29/2021   CHOL 196 08/29/2021   TRIG 156 (H) 08/29/2021   HDL 41 08/29/2021   LDLCALC 127 (H) 08/29/2021   ALT 16 08/29/2021   AST 19 08/29/2021   NA 141 08/29/2021   K 4.5 08/29/2021   CL 106 08/29/2021   CREATININE 0.89 08/29/2021   BUN 12 08/29/2021   CO2 20 08/29/2021   HGBA1C 6.0 (H) 10/08/2021      Assessment & Plan:  1. Type 2 diabetes mellitus with hyperglycemia, without long-term current use of insulin (HCC)-well controlled - Microalbumin / creatinine urine ratio - Hemoglobin A1c - CBC with Differential/Platelet - Comprehensive metabolic panel - Lipid panel - T4, free - TSH - VITAMIN D 25 Hydroxy (Vit-D Deficiency, Fractures)  2. Hyperlipidemia associated with type 2 diabetes mellitus (HCC)-not at goal - CBC with Differential/Platelet - Comprehensive metabolic panel - Lipid panel  3. Anxiety-well controlled - T4, free - TSH  4. Chronic joint pain - VITAMIN D 25 Hydroxy (Vit-D Deficiency, Fractures) - C-reactive protein - Sedimentation Rate  5. Atherosclerotic cardiovascular disease - CBC with  Differential/Platelet - Comprehensive metabolic panel - Lipid panel  6. Cigarette nicotine dependence with nicotine-induced disorder - CBC with Differential/Platelet - Comprehensive metabolic panel  7. Class 2 severe obesity due to excess calories with serious comorbidity and body mass index (BMI) of 37.0 to 37.9 in adult (HCC) - Hemoglobin A1c - CBC with Differential/Platelet - Comprehensive metabolic panel - Lipid panel - T4, free - TSH - VITAMIN D 25 Hydroxy (Vit-D Deficiency, Fractures)  8. Statin declined -continue heart healthy diet and regular physical activity     We will call you with lab results Continue heart healthy diet and regular physical activity Follow-up in 15-month  Follow-up: 363-month An After Visit Summary was printed and given to the patient.  I, ShRip HarbourNP, have reviewed all documentation for this visit. The documentation on 12/04/21 for the exam, diagnosis, procedures, and orders are all accurate and complete.    Signed, ShRip HarbourNP [phone]

## 2021-12-05 LAB — LIPID PANEL
Chol/HDL Ratio: 4.6 ratio — ABNORMAL HIGH (ref 0.0–4.4)
Cholesterol, Total: 197 mg/dL (ref 100–199)
HDL: 43 mg/dL (ref >39)
LDL Chol Calc (NIH): 133 mg/dL — ABNORMAL HIGH (ref 0–99)
Triglycerides: 117 mg/dL (ref 0–149)
VLDL Cholesterol Cal: 21 mg/dL (ref 5–40)

## 2021-12-05 LAB — CBC WITH DIFFERENTIAL/PLATELET
Basophils Absolute: 0.1 10*3/uL (ref 0.0–0.2)
Basos: 1 %
EOS (ABSOLUTE): 0.2 10*3/uL (ref 0.0–0.4)
Eos: 2 %
Hematocrit: 46.9 % — ABNORMAL HIGH (ref 34.0–46.6)
Hemoglobin: 15.7 g/dL (ref 11.1–15.9)
Immature Grans (Abs): 0 10*3/uL (ref 0.0–0.1)
Immature Granulocytes: 0 %
Lymphocytes Absolute: 2.7 10*3/uL (ref 0.7–3.1)
Lymphs: 26 %
MCH: 31.6 pg (ref 26.6–33.0)
MCHC: 33.5 g/dL (ref 31.5–35.7)
MCV: 94 fL (ref 79–97)
Monocytes Absolute: 0.8 10*3/uL (ref 0.1–0.9)
Monocytes: 8 %
Neutrophils Absolute: 6.4 10*3/uL (ref 1.4–7.0)
Neutrophils: 63 %
Platelets: 275 10*3/uL (ref 150–450)
RBC: 4.97 x10E6/uL (ref 3.77–5.28)
RDW: 11.6 % — ABNORMAL LOW (ref 11.7–15.4)
WBC: 10.2 10*3/uL (ref 3.4–10.8)

## 2021-12-05 LAB — COMPREHENSIVE METABOLIC PANEL
ALT: 14 IU/L (ref 0–32)
AST: 17 IU/L (ref 0–40)
Albumin/Globulin Ratio: 1.3 (ref 1.2–2.2)
Albumin: 4 g/dL (ref 3.8–4.9)
Alkaline Phosphatase: 66 IU/L (ref 44–121)
BUN/Creatinine Ratio: 13 (ref 12–28)
BUN: 12 mg/dL (ref 8–27)
Bilirubin Total: 0.2 mg/dL (ref 0.0–1.2)
CO2: 21 mmol/L (ref 20–29)
Calcium: 9 mg/dL (ref 8.7–10.3)
Chloride: 106 mmol/L (ref 96–106)
Creatinine, Ser: 0.89 mg/dL (ref 0.57–1.00)
Globulin, Total: 3.1 g/dL (ref 1.5–4.5)
Glucose: 102 mg/dL — ABNORMAL HIGH (ref 70–99)
Potassium: 4.5 mmol/L (ref 3.5–5.2)
Sodium: 142 mmol/L (ref 134–144)
Total Protein: 7.1 g/dL (ref 6.0–8.5)
eGFR: 74 mL/min/{1.73_m2} (ref >59)

## 2021-12-05 LAB — T4, FREE: Free T4: 1.12 ng/dL (ref 0.82–1.77)

## 2021-12-05 LAB — CARDIOVASCULAR RISK ASSESSMENT

## 2021-12-05 LAB — MICROALBUMIN / CREATININE URINE RATIO
Creatinine, Urine: 52.5 mg/dL
Microalb/Creat Ratio: <6 mg/g creat (ref 0–29)
Microalbumin, Urine: <3 ug/mL

## 2021-12-05 LAB — SEDIMENTATION RATE: Sed Rate: 29 mm/hr (ref 0–40)

## 2021-12-05 LAB — C-REACTIVE PROTEIN: CRP: 7 mg/L (ref 0–10)

## 2021-12-05 LAB — HEMOGLOBIN A1C
Est. average glucose Bld gHb Est-mCnc: 123 mg/dL
Hgb A1c MFr Bld: 5.9 % — ABNORMAL HIGH (ref 4.8–5.6)

## 2021-12-05 LAB — TSH: TSH: 3.69 u[IU]/mL (ref 0.450–4.500)

## 2021-12-05 LAB — VITAMIN D 25 HYDROXY (VIT D DEFICIENCY, FRACTURES): Vit D, 25-Hydroxy: 24.6 ng/mL — ABNORMAL LOW (ref 30.0–100.0)

## 2021-12-26 ENCOUNTER — Ambulatory Visit (INDEPENDENT_AMBULATORY_CARE_PROVIDER_SITE_OTHER): Payer: Medicare HMO | Admitting: Physician Assistant

## 2021-12-26 ENCOUNTER — Encounter: Payer: Self-pay | Admitting: Physician Assistant

## 2021-12-26 VITALS — BP 100/60 | HR 73 | Temp 97.5°F | Ht 59.0 in | Wt 185.6 lb

## 2021-12-26 DIAGNOSIS — Z90710 Acquired absence of both cervix and uterus: Secondary | ICD-10-CM

## 2021-12-26 DIAGNOSIS — M545 Low back pain, unspecified: Secondary | ICD-10-CM | POA: Diagnosis not present

## 2021-12-26 MED ORDER — CYCLOBENZAPRINE HCL 5 MG PO TABS: 5.0000 mg | ORAL_TABLET | Freq: Every day | ORAL | 1 refills | Status: DC

## 2021-12-26 NOTE — Progress Notes (Signed)
Acute Office Visit  Subjective:    Patient ID: Sherry Montgomery, female    DOB: Jul 19, 1960, 61 y.o.   MRN: [id number]  Chief Complaint  Patient presents with   Back Pain    HPI: Patient is in today for complaints of lower back pain.  She states she has had intermittent problems for the past 2 months.  Hurts after standing long periods or with walking long periods.  Does have some stiffness when going from sitting to standing position.  She has been told she has had OA in the past -  Denies weakness, numbness or pain in lower extremities Past Medical History:  Diagnosis Date   Arthritis    Asthma    Diverticulitis    Heartburn    History of COVID-19    Kidney stones    Migraine     Past Surgical History:  Procedure Laterality Date   APPENDECTOMY  1985   disc fusion in neck  2022   [place]   ROTATOR CUFF REPAIR  2016   TOTAL ABDOMINAL HYSTERECTOMY  1984    Family History  Problem Relation Age of Onset   Alzheimer's disease Mother    Lupus Father    Lung cancer Sister    Leukemia Sister    Alzheimer's disease Sister    Alzheimer's disease Brother     Social History   Socioeconomic History   Marital status: Married    Spouse name: Not on file   Number of children: 2   Years of education: Not on file   Highest education level: Not on file  Occupational History   Not on file  Tobacco Use   Smoking status: Every Day    Packs/day: 0.50    Types: Cigarettes   Smokeless tobacco: Never  Vaping Use   Vaping Use: Never used  Substance and Sexual Activity   Alcohol use: Never   Drug use: Never   Sexual activity: Yes    Partners: Male  Other Topics Concern   Not on file  Social History Narrative   Not on file   Social Determinants of Health   Financial Resource Strain: Low Risk  (07/01/2021)   Overall Financial Resource Strain (CARDIA)    Difficulty of Paying Living Expenses: Not hard at all  Food Insecurity: No  Food Insecurity (07/01/2021)   Hunger Vital Sign    Worried About Running Out of Food in the Last Year: Never true    Ran Out of Food in the Last Year: Never true  Transportation Needs: No Transportation Needs (07/01/2021)   PRAPARE - Hydrologist (Medical): No    Lack of Transportation (Non-Medical): No  Physical Activity: Inactive (07/01/2021)   Exercise Vital Sign    Days of Exercise per Week: 0 days    Minutes of Exercise per Session: 0 min  Stress: No Stress Concern Present (07/01/2021)   [place]    Feeling of Stress : Not at all  Social Connections: Moderately Integrated (07/01/2021)   Social Connection and Isolation Panel [NHANES]    Frequency of Communication with Friends and Family: Three times a week    Frequency of Social Gatherings with Friends and Family: Once a week    Attends Religious Services: More than 4 times per year    Active Member of [employer] or Organizations: No    Attends Archivist  Meetings: Never    Marital Status: Married  Human resources officer Violence: Not At Risk (07/01/2021)   Humiliation, Afraid, Rape, and Kick questionnaire    Fear of Current or Ex-Partner: No    Emotionally Abused: No    Physically Abused: No    Sexually Abused: No    Outpatient Medications Prior to Visit  Medication Sig Dispense Refill   Cholecalciferol (VITAMIN D-3) 25 MCG (1000 UT) CAPS Take by mouth.     diazepam (VALIUM) 2 MG tablet Take 1 tablet (2 mg total) by mouth every 6 (six) hours as needed. 30 tablet 3   fluticasone (FLONASE) 50 MCG/ACT nasal spray Place 2 sprays into both nostrils daily. 16 g 6   Naproxen Sodium (ALEVE PO) Take 1 tablet by mouth daily as needed.     alendronate (FOSAMAX) 70 MG tablet Take 1 tablet (70 mg total) by mouth once a week. Take with a full glass of water on an empty stomach. (Patient not taking: Reported on 12/26/2021) 4 tablet 11   meclizine  (ANTIVERT) 25 MG tablet Take 1 tablet (25 mg total) by mouth 3 (three) times daily as needed for dizziness. (Patient not taking: Reported on 12/26/2021) 30 tablet 0   No facility-administered medications prior to visit.    Allergies  Allergen Reactions   Codeine Anaphylaxis    Throat swelling Throat swelling Throat swelling    Shellfish Allergy Anaphylaxis    Other reaction(s): Shock (ALLERGY) Other reaction(s): Shock (ALLERGY)    Ciprofloxacin Nausea And Vomiting   Erythromycin    Ibuprofen Other (See Comments)    Other reaction(s): Sweating (intolerance) Other reaction(s): Sweating (intolerance)    Silver Nitrate Swelling   Sulfa Antibiotics    Tramadol Other (See Comments)    ask ask ask    Tramadol Hcl    Acetaminophen Rash   Ampicillin Rash   Cefdinir Rash   Cephalexin Rash   Doxycycline Rash   Fish-Derived Products Rash   Hydrocodone-Acetaminophen Nausea And Vomiting and Rash    ask ask ask    Sulfamethoxazole Rash    Review of Systems CONSTITUTIONAL: Negative for chills, fatigue, fever,  CARDIOVASCULAR: Negative for chest pain, dizziness,  RESPIRATORY: Negative for recent cough and dyspnea.  MSK: see HPI INTEGUMENTARY: Negative for rash.       Objective:    PHYSICAL EXAM:   VS: BP 100/60   Pulse 73   Temp (!) 97.5 F (36.4 C)   Ht _0  (1.499 m)   Wt 185 lb 9.6 oz (84.2 kg)   SpO2 97%   BMI 37.49 kg/m   GEN: Well nourished, well developed, in no acute distress  Cardiac: RRR; no murmurs, rubs,  Respiratory:  normal respiratory rate and pattern with no distress - normal breath sounds with no rales, rhonchi, wheezes or rubs MS- tender to lower back to palpation - mild muscle spasm noted Skin: warm and dry, no rash  Neuro:  Alert and Oriented x 3, Strength and sensation are intact - CN II-Xii grossly intact    Lab Results  Component Value Date   TSH 3.690 12/04/2021   Lab Results  Component Value Date   WBC 10.2 12/04/2021   HGB  15.7 12/04/2021   HCT 46.9 (H) 12/04/2021   MCV 94 12/04/2021   PLT 275 12/04/2021   Lab Results  Component Value Date   NA 142 12/04/2021   K 4.5 12/04/2021   CO2 21 12/04/2021   GLUCOSE 102 (H) 12/04/2021   BUN 12  12/04/2021   CREATININE 0.89 12/04/2021   BILITOT 0.2 12/04/2021   ALKPHOS 66 12/04/2021   AST 17 12/04/2021   ALT 14 12/04/2021   PROT 7.1 12/04/2021   ALBUMIN 4.0 12/04/2021   CALCIUM 9.0 12/04/2021   EGFR 74 12/04/2021   Lab Results  Component Value Date   CHOL 197 12/04/2021   Lab Results  Component Value Date   HDL 43 12/04/2021   Lab Results  Component Value Date   LDLCALC 133 (H) 12/04/2021   Lab Results  Component Value Date   TRIG 117 12/04/2021   Lab Results  Component Value Date   CHOLHDL 4.6 (H) 12/04/2021   Lab Results  Component Value Date   HGBA1C 5.9 (H) 12/04/2021       Assessment & Plan:   Problem List Items Addressed This Visit       Other   Backache - Primary   Relevant Medications   cyclobenzaprine (FLEXERIL) 5 MG tablet Pt can tolerate aleve - to take q 12 hours Rom exercises given Alternate heat/ice        Meds ordered this encounter  Medications   cyclobenzaprine (FLEXERIL) 5 MG tablet    Sig: Take 1 tablet (5 mg total) by mouth at bedtime.    Dispense:  30 tablet    Refill:  1    Order Specific Question:   Supervising Provider    Answer:   Shelton Silvas    No orders of the defined types were placed in this encounter.    Follow-up: Return if symptoms worsen or fail to improve, for as scheduled for chronic visit.  An After Visit Summary was printed and given to the patient.  Yetta Flock [facility] [phone]

## 2022-02-12 ENCOUNTER — Telehealth: Payer: Self-pay

## 2022-02-12 NOTE — Telephone Encounter (Signed)
Patient called and stated she is going to see a spine specialist 02/24/2022 Dr.Torrealba in [place]. She is wanting to know if you can order a CT of spine before her appointment due to they want her to have a CT before she comes. Patient would like to go to [place] for CT. Please advise

## 2022-02-13 ENCOUNTER — Encounter: Payer: Self-pay | Admitting: Physician Assistant

## 2022-02-13 NOTE — Telephone Encounter (Signed)
Called patient, was not able to leave voicemail due to voicemail being full.

## 2022-02-16 DIAGNOSIS — I700 Atherosclerosis of aorta: Secondary | ICD-10-CM

## 2022-02-16 DIAGNOSIS — J439 Emphysema, unspecified: Secondary | ICD-10-CM

## 2022-02-16 HISTORY — DX: Atherosclerosis of aorta: I70.0

## 2022-02-16 HISTORY — DX: Emphysema, unspecified: J43.9

## 2022-02-18 ENCOUNTER — Telehealth: Payer: Self-pay

## 2022-02-18 ENCOUNTER — Encounter: Payer: Self-pay | Admitting: Physician Assistant

## 2022-02-18 DIAGNOSIS — M47816 Spondylosis without myelopathy or radiculopathy, lumbar region: Secondary | ICD-10-CM | POA: Diagnosis not present

## 2022-02-18 DIAGNOSIS — M545 Low back pain, unspecified: Secondary | ICD-10-CM | POA: Diagnosis not present

## 2022-02-18 NOTE — Telephone Encounter (Signed)
PA submitted and approved via covermymeds for [employer].

## 2022-02-18 NOTE — Telephone Encounter (Signed)
Yes for her back and she would like to go to [place]

## 2022-02-18 NOTE — Telephone Encounter (Signed)
Order given for lumbar spine xrays

## 2022-02-24 ENCOUNTER — Other Ambulatory Visit: Payer: Self-pay | Admitting: Physician Assistant

## 2022-02-24 DIAGNOSIS — E559 Vitamin D deficiency, unspecified: Secondary | ICD-10-CM

## 2022-02-24 DIAGNOSIS — E782 Mixed hyperlipidemia: Secondary | ICD-10-CM

## 2022-02-24 DIAGNOSIS — E1165 Type 2 diabetes mellitus with hyperglycemia: Secondary | ICD-10-CM

## 2022-02-24 DIAGNOSIS — M545 Low back pain, unspecified: Secondary | ICD-10-CM | POA: Diagnosis not present

## 2022-02-24 DIAGNOSIS — M7062 Trochanteric bursitis, left hip: Secondary | ICD-10-CM | POA: Diagnosis not present

## 2022-02-24 DIAGNOSIS — M5431 Sciatica, right side: Secondary | ICD-10-CM | POA: Diagnosis not present

## 2022-02-24 DIAGNOSIS — E1169 Type 2 diabetes mellitus with other specified complication: Secondary | ICD-10-CM

## 2022-02-24 DIAGNOSIS — M5432 Sciatica, left side: Secondary | ICD-10-CM | POA: Diagnosis not present

## 2022-02-27 DIAGNOSIS — M545 Low back pain, unspecified: Secondary | ICD-10-CM | POA: Diagnosis not present

## 2022-02-27 DIAGNOSIS — M5431 Sciatica, right side: Secondary | ICD-10-CM | POA: Diagnosis not present

## 2022-02-27 DIAGNOSIS — M5432 Sciatica, left side: Secondary | ICD-10-CM | POA: Diagnosis not present

## 2022-03-03 DIAGNOSIS — M5432 Sciatica, left side: Secondary | ICD-10-CM | POA: Diagnosis not present

## 2022-03-03 DIAGNOSIS — M5431 Sciatica, right side: Secondary | ICD-10-CM | POA: Diagnosis not present

## 2022-03-03 DIAGNOSIS — M545 Low back pain, unspecified: Secondary | ICD-10-CM | POA: Diagnosis not present

## 2022-03-04 DIAGNOSIS — H52223 Regular astigmatism, bilateral: Secondary | ICD-10-CM | POA: Diagnosis not present

## 2022-03-04 DIAGNOSIS — H25813 Combined forms of age-related cataract, bilateral: Secondary | ICD-10-CM | POA: Diagnosis not present

## 2022-03-04 DIAGNOSIS — H524 Presbyopia: Secondary | ICD-10-CM | POA: Diagnosis not present

## 2022-03-04 DIAGNOSIS — H5213 Myopia, bilateral: Secondary | ICD-10-CM | POA: Diagnosis not present

## 2022-03-05 DIAGNOSIS — H524 Presbyopia: Secondary | ICD-10-CM | POA: Diagnosis not present

## 2022-03-05 DIAGNOSIS — H52223 Regular astigmatism, bilateral: Secondary | ICD-10-CM | POA: Diagnosis not present

## 2022-03-16 DIAGNOSIS — R198 Other specified symptoms and signs involving the digestive system and abdomen: Secondary | ICD-10-CM | POA: Diagnosis not present

## 2022-03-16 DIAGNOSIS — K760 Fatty (change of) liver, not elsewhere classified: Secondary | ICD-10-CM | POA: Diagnosis not present

## 2022-03-16 DIAGNOSIS — K5790 Diverticulosis of intestine, part unspecified, without perforation or abscess without bleeding: Secondary | ICD-10-CM | POA: Diagnosis not present

## 2022-03-17 DIAGNOSIS — M5431 Sciatica, right side: Secondary | ICD-10-CM | POA: Diagnosis not present

## 2022-03-17 DIAGNOSIS — M545 Low back pain, unspecified: Secondary | ICD-10-CM | POA: Diagnosis not present

## 2022-03-17 DIAGNOSIS — M5432 Sciatica, left side: Secondary | ICD-10-CM | POA: Diagnosis not present

## 2022-03-23 ENCOUNTER — Other Ambulatory Visit: Payer: Medicare HMO

## 2022-03-23 DIAGNOSIS — E559 Vitamin D deficiency, unspecified: Secondary | ICD-10-CM

## 2022-03-23 DIAGNOSIS — E782 Mixed hyperlipidemia: Secondary | ICD-10-CM | POA: Diagnosis not present

## 2022-03-23 DIAGNOSIS — E1165 Type 2 diabetes mellitus with hyperglycemia: Secondary | ICD-10-CM | POA: Diagnosis not present

## 2022-03-23 DIAGNOSIS — E1169 Type 2 diabetes mellitus with other specified complication: Secondary | ICD-10-CM | POA: Diagnosis not present

## 2022-03-23 DIAGNOSIS — E785 Hyperlipidemia, unspecified: Secondary | ICD-10-CM | POA: Diagnosis not present

## 2022-03-24 ENCOUNTER — Other Ambulatory Visit: Payer: Self-pay | Admitting: Physician Assistant

## 2022-03-24 DIAGNOSIS — M5432 Sciatica, left side: Secondary | ICD-10-CM | POA: Diagnosis not present

## 2022-03-24 DIAGNOSIS — M5431 Sciatica, right side: Secondary | ICD-10-CM | POA: Diagnosis not present

## 2022-03-24 DIAGNOSIS — M545 Low back pain, unspecified: Secondary | ICD-10-CM | POA: Diagnosis not present

## 2022-03-24 LAB — LIPID PANEL
Chol/HDL Ratio: 5.2 ratio — ABNORMAL HIGH (ref 0.0–4.4)
Cholesterol, Total: 187 mg/dL (ref 100–199)
HDL: 36 mg/dL — ABNORMAL LOW (ref >39)
LDL Chol Calc (NIH): 131 mg/dL — ABNORMAL HIGH (ref 0–99)
Triglycerides: 107 mg/dL (ref 0–149)
VLDL Cholesterol Cal: 20 mg/dL (ref 5–40)

## 2022-03-24 LAB — CBC WITH DIFFERENTIAL/PLATELET
Basophils Absolute: 0.1 10*3/uL (ref 0.0–0.2)
Basos: 1 %
EOS (ABSOLUTE): 0.2 10*3/uL (ref 0.0–0.4)
Eos: 3 %
Hematocrit: 45 % (ref 34.0–46.6)
Hemoglobin: 15.3 g/dL (ref 11.1–15.9)
Immature Grans (Abs): 0 10*3/uL (ref 0.0–0.1)
Immature Granulocytes: 0 %
Lymphocytes Absolute: 2.6 10*3/uL (ref 0.7–3.1)
Lymphs: 30 %
MCH: 31.7 pg (ref 26.6–33.0)
MCHC: 34 g/dL (ref 31.5–35.7)
MCV: 93 fL (ref 79–97)
Monocytes Absolute: 0.8 10*3/uL (ref 0.1–0.9)
Monocytes: 9 %
Neutrophils Absolute: 5.1 10*3/uL (ref 1.4–7.0)
Neutrophils: 57 %
Platelets: 240 10*3/uL (ref 150–450)
RBC: 4.82 x10E6/uL (ref 3.77–5.28)
RDW: 11.7 % (ref 11.7–15.4)
WBC: 8.8 10*3/uL (ref 3.4–10.8)

## 2022-03-24 LAB — TSH: TSH: 1.74 u[IU]/mL (ref 0.450–4.500)

## 2022-03-24 LAB — COMPREHENSIVE METABOLIC PANEL
ALT: 16 IU/L (ref 0–32)
AST: 18 IU/L (ref 0–40)
Albumin/Globulin Ratio: 1.3 (ref 1.2–2.2)
Albumin: 3.7 g/dL — ABNORMAL LOW (ref 3.8–4.9)
Alkaline Phosphatase: 69 IU/L (ref 44–121)
BUN/Creatinine Ratio: 15 (ref 12–28)
BUN: 12 mg/dL (ref 8–27)
Bilirubin Total: 0.4 mg/dL (ref 0.0–1.2)
CO2: 23 mmol/L (ref 20–29)
Calcium: 8.7 mg/dL (ref 8.7–10.3)
Chloride: 104 mmol/L (ref 96–106)
Creatinine, Ser: 0.79 mg/dL (ref 0.57–1.00)
Globulin, Total: 2.9 g/dL (ref 1.5–4.5)
Glucose: 98 mg/dL (ref 70–99)
Potassium: 4.1 mmol/L (ref 3.5–5.2)
Sodium: 141 mmol/L (ref 134–144)
Total Protein: 6.6 g/dL (ref 6.0–8.5)
eGFR: 86 mL/min/{1.73_m2} (ref >59)

## 2022-03-24 LAB — HEMOGLOBIN A1C
Est. average glucose Bld gHb Est-mCnc: 128 mg/dL
Hgb A1c MFr Bld: 6.1 % — ABNORMAL HIGH (ref 4.8–5.6)

## 2022-03-24 LAB — VITAMIN D 25 HYDROXY (VIT D DEFICIENCY, FRACTURES): Vit D, 25-Hydroxy: 20.7 ng/mL — ABNORMAL LOW (ref 30.0–100.0)

## 2022-03-24 LAB — CARDIOVASCULAR RISK ASSESSMENT

## 2022-03-24 MED ORDER — VITAMIN D (ERGOCALCIFEROL) 1.25 MG (50000 UNIT) PO CAPS: 50000.0000 [IU] | ORAL_CAPSULE | ORAL | 5 refills | Status: DC

## 2022-03-26 DIAGNOSIS — M5431 Sciatica, right side: Secondary | ICD-10-CM | POA: Diagnosis not present

## 2022-03-26 DIAGNOSIS — M545 Low back pain, unspecified: Secondary | ICD-10-CM | POA: Diagnosis not present

## 2022-03-26 DIAGNOSIS — M5432 Sciatica, left side: Secondary | ICD-10-CM | POA: Diagnosis not present

## 2022-03-27 ENCOUNTER — Encounter: Payer: Self-pay | Admitting: Physician Assistant

## 2022-03-27 ENCOUNTER — Ambulatory Visit (INDEPENDENT_AMBULATORY_CARE_PROVIDER_SITE_OTHER): Payer: Medicare HMO | Admitting: Physician Assistant

## 2022-03-27 VITALS — BP 120/70 | HR 71 | Temp 98.0°F | Resp 14 | Ht 59.0 in | Wt 180.0 lb

## 2022-03-27 DIAGNOSIS — E1165 Type 2 diabetes mellitus with hyperglycemia: Secondary | ICD-10-CM | POA: Diagnosis not present

## 2022-03-27 DIAGNOSIS — E559 Vitamin D deficiency, unspecified: Secondary | ICD-10-CM | POA: Diagnosis not present

## 2022-03-27 DIAGNOSIS — Z1231 Encounter for screening mammogram for malignant neoplasm of breast: Secondary | ICD-10-CM | POA: Diagnosis not present

## 2022-03-27 DIAGNOSIS — Z122 Encounter for screening for malignant neoplasm of respiratory organs: Secondary | ICD-10-CM

## 2022-03-27 DIAGNOSIS — Z23 Encounter for immunization: Secondary | ICD-10-CM

## 2022-03-27 DIAGNOSIS — Z5320 Procedure and treatment not carried out because of patient's decision for unspecified reasons: Secondary | ICD-10-CM | POA: Insufficient documentation

## 2022-03-27 DIAGNOSIS — E782 Mixed hyperlipidemia: Secondary | ICD-10-CM | POA: Diagnosis not present

## 2022-03-27 DIAGNOSIS — Z720 Tobacco use: Secondary | ICD-10-CM | POA: Diagnosis not present

## 2022-03-27 NOTE — Progress Notes (Signed)
Established Patient Office Visit  Subjective:  Patient ID: Sherry Montgomery, female    DOB: [birth year]  Age: 62 y.o. MRN: [id number]  CC:  Chief Complaint  Patient presents with   Diabetes   Hyperlipidemia    HPI Sherry Montgomery presents for chronic follow up  Pt with history of diabetes - had hgb A1c of 6.7 in 5/23.  She has currently managed with diet - is trying to decrease carbs/sugars  Pt with diagnosis of vit D def - recently had labwork that showed Vit d low - rx sent to start weekly supplement  Pt with history of anxiety - rarely uses valium as needed  Pt would like to schedule next mammogram appt Pt will be following with Dr Lyda Jester for colonoscopy in May Pt would like to update Tdap  Pt is candidate for low dose chest CT - will schedule She has been a smoker for more than 50 years and currently smoking 1/2 ppd  Past Medical History:  Diagnosis Date   Arthritis    Asthma    Diverticulitis    Heartburn    History of COVID-19    Kidney stones    Migraine     Past Surgical History:  Procedure Laterality Date   APPENDECTOMY  1985   disc fusion in neck  2022   [place]  2016   TOTAL ABDOMINAL HYSTERECTOMY  1984    Family History  Problem Relation Age of Onset   Alzheimer's disease Mother    Lupus Father    Lung cancer Sister    Leukemia Sister    Alzheimer's disease Sister    Alzheimer's disease Brother     Social History   Socioeconomic History   Marital status: Married    Spouse name: Not on file   Number of children: 2   Years of education: Not on file   Highest education level: Not on file  Occupational History   Not on file  Tobacco Use   Smoking status: Every Day    Packs/day: 0.50    Types: Cigarettes   Smokeless tobacco: Never  Vaping Use   Vaping Use: Never used  Substance and Sexual Activity   Alcohol use: Never   Drug use: Never   Sexual activity: Yes     Partners: Male  Other Topics Concern   Not on file  Social History Narrative   Not on file   Social Determinants of Health   Financial Resource Strain: Low Risk  (07/01/2021)   Overall Financial Resource Strain (CARDIA)    Difficulty of Paying Living Expenses: Not hard at all  Food Insecurity: No Food Insecurity (07/01/2021)   Hunger Vital Sign    Worried About Running Out of Food in the Last Year: Never true    Ran Out of Food in the Last Year: Never true  Transportation Needs: No Transportation Needs (07/01/2021)   PRAPARE - Hydrologist (Medical): No    Lack of Transportation (Non-Medical): No  Physical Activity: Inactive (07/01/2021)   Exercise Vital Sign    Days of Exercise per Week: 0 days    Minutes of Exercise per Session: 0 min  Stress: No Stress Concern Present (07/01/2021)   [place]    Feeling of Stress : Not at all  Social Connections: Moderately Integrated (07/01/2021)   Social Connection and  Isolation Panel [NHANES]    Frequency of Communication with Friends and Family: Three times a week    Frequency of Social Gatherings with Friends and Family: Once a week    Attends Religious Services: More than 4 times per year    Active Member of [employer] or Organizations: No    Attends Archivist Meetings: Never    Marital Status: Married  Human resources officer Violence: Not At Risk (07/01/2021)   Humiliation, Afraid, Rape, and Kick questionnaire    Fear of Current or Ex-Partner: No    Emotionally Abused: No    Physically Abused: No    Sexually Abused: No     Current Outpatient Medications:    cyclobenzaprine (FLEXERIL) 5 MG tablet, Take 1 tablet (5 mg total) by mouth at bedtime., Disp: 30 tablet, Rfl: 1   diazepam (VALIUM) 2 MG tablet, Take 1 tablet (2 mg total) by mouth every 6 (six) hours as needed., Disp: 30 tablet, Rfl: 3   fluticasone (FLONASE) 50 MCG/ACT nasal spray,  Place 2 sprays into both nostrils daily., Disp: 16 g, Rfl: 6   Naproxen Sodium (ALEVE PO), Take 1 tablet by mouth daily as needed., Disp: , Rfl:    Vitamin D, Ergocalciferol, (DRISDOL) 1.25 MG (50000 UNIT) CAPS capsule, Take 1 capsule (50,000 Units total) by mouth every 7 (seven) days., Disp: 5 capsule, Rfl: 5   Allergies  Allergen Reactions   Codeine Anaphylaxis    Throat swelling Throat swelling Throat swelling    Shellfish Allergy Anaphylaxis    Other reaction(s): Shock (ALLERGY) Other reaction(s): Shock (ALLERGY)    Ciprofloxacin Nausea And Vomiting   Erythromycin    Ibuprofen Other (See Comments)    Other reaction(s): Sweating (intolerance) Other reaction(s): Sweating (intolerance)    Silver Nitrate Swelling   Sulfa Antibiotics    Sulfacetamide    Tramadol Other (See Comments)    ask ask ask    Tramadol Hcl    Acetaminophen Rash   Ampicillin Rash   Cefdinir Rash   Cephalexin Rash   Doxycycline Rash   Fish-Derived Products Rash   Hydrocodone-Acetaminophen Nausea And Vomiting and Rash    ask ask ask    Sulfamethoxazole Rash    ROS CONSTITUTIONAL: Negative for chills, fatigue, fever, E/N/T: Negative for ear pain, nasal congestion and sore throat.  CARDIOVASCULAR: Negative for chest pain, dizziness, palpitations and pedal edema.  RESPIRATORY: Negative for recent cough and dyspnea.  GASTROINTESTINAL: Negative for abdominal pain, acid reflux symptoms, constipation, diarrhea, nausea and vomiting.  MSK: Negative for arthralgias and myalgias.  INTEGUMENTARY: Negative for rash.  NEUROLOGICAL: Negative for dizziness and headaches.  PSYCHIATRIC: Negative for sleep disturbance and to question depression screen.  Negative for depression, negative for anhedonia.        Objective:    PHYSICAL EXAM:   VS: BP 120/70   Pulse 98   Temp 98 F (36.7 C)   Resp 14   Ht 4' 11"$  (1.499 m)   Wt 180 lb (81.6 kg)   LMP  (LMP Unknown)   SpO2 (!) 71%   BMI 36.36 kg/m    GEN: Well nourished, well developed, in no acute distress  Cardiac: RRR; no murmurs, rubs, or gallops,no edema -  Respiratory:  normal respiratory rate and pattern with no distress - normal breath sounds with no rales, rhonchi, wheezes or rubs MS: no deformity or atrophy  Skin: warm and dry, no rash  Psych: euthymic mood, appropriate affect and demeanor      Health Maintenance  Due  Topic Date Due   OPHTHALMOLOGY EXAM  Never done   Lung Cancer Screening  Never done    There are no preventive care reminders to display for this patient.  Lab Results  Component Value Date   TSH 1.740 03/23/2022   Lab Results  Component Value Date   WBC 8.8 03/23/2022   HGB 15.3 03/23/2022   HCT 45.0 03/23/2022   MCV 93 03/23/2022   PLT 240 03/23/2022   Lab Results  Component Value Date   NA 141 03/23/2022   K 4.1 03/23/2022   CO2 23 03/23/2022   GLUCOSE 98 03/23/2022   BUN 12 03/23/2022   CREATININE 0.79 03/23/2022   BILITOT 0.4 03/23/2022   ALKPHOS 69 03/23/2022   AST 18 03/23/2022   ALT 16 03/23/2022   PROT 6.6 03/23/2022   ALBUMIN 3.7 (L) 03/23/2022   CALCIUM 8.7 03/23/2022   EGFR 86 03/23/2022   Lab Results  Component Value Date   CHOL 187 03/23/2022   Lab Results  Component Value Date   HDL 36 (L) 03/23/2022   Lab Results  Component Value Date   LDLCALC 131 (H) 03/23/2022   Lab Results  Component Value Date   TRIG 107 03/23/2022   Lab Results  Component Value Date   CHOLHDL 5.2 (H) 03/23/2022   Lab Results  Component Value Date   HGBA1C 6.1 (H) 03/23/2022      Assessment & Plan:   Problem List Items Addressed This Visit   None Visit Diagnoses     Type 2 diabetes mellitus with hyperglycemia, without long-term current use of insulin (HCC)    -  Primary Continue to watch diet   Mixed hyperlipidemia     Watch diet Pt has declined statin   Vitamin D insufficiency     Start weekly supplement   Need for diphtheria-tetanus-pertussis (Tdap) vaccine        Relevant Orders   Tdap vaccine greater than or equal to 7yo IM (Completed)   Encounter for screening mammogram for breast cancer       Relevant Orders   MM DIGITAL SCREENING BILATERAL   Encounter for screening for lung cancer       Relevant Orders   CT CHEST LUNG CA SCREEN LOW DOSE W/O CM   Tobacco abuse       Relevant Orders   CT CHEST LUNG CA SCREEN LOW DOSE W/O CM Recommend smoking cessation       No orders of the defined types were placed in this encounter.   Follow-up: Return in about 6 months (around 09/25/2022) for chronic fasting physical.    SARA R Jamerion Cabello, PA-C

## 2022-03-31 DIAGNOSIS — M545 Low back pain, unspecified: Secondary | ICD-10-CM | POA: Diagnosis not present

## 2022-03-31 DIAGNOSIS — M5432 Sciatica, left side: Secondary | ICD-10-CM | POA: Diagnosis not present

## 2022-03-31 DIAGNOSIS — M5431 Sciatica, right side: Secondary | ICD-10-CM | POA: Diagnosis not present

## 2022-04-02 DIAGNOSIS — R69 Illness, unspecified: Secondary | ICD-10-CM | POA: Diagnosis not present

## 2022-04-02 DIAGNOSIS — Z122 Encounter for screening for malignant neoplasm of respiratory organs: Secondary | ICD-10-CM | POA: Diagnosis not present

## 2022-04-02 DIAGNOSIS — F17210 Nicotine dependence, cigarettes, uncomplicated: Secondary | ICD-10-CM | POA: Diagnosis not present

## 2022-04-02 DIAGNOSIS — Z87891 Personal history of nicotine dependence: Secondary | ICD-10-CM | POA: Diagnosis not present

## 2022-04-06 ENCOUNTER — Other Ambulatory Visit: Payer: Self-pay | Admitting: Physician Assistant

## 2022-04-06 DIAGNOSIS — I700 Atherosclerosis of aorta: Secondary | ICD-10-CM

## 2022-04-06 DIAGNOSIS — E782 Mixed hyperlipidemia: Secondary | ICD-10-CM

## 2022-04-06 DIAGNOSIS — J438 Other emphysema: Secondary | ICD-10-CM

## 2022-04-06 MED ORDER — TRELEGY ELLIPTA 100-62.5-25 MCG/ACT IN AEPB: 1.0000 | INHALATION_SPRAY | Freq: Every day | RESPIRATORY_TRACT | 11 refills | Status: DC

## 2022-04-07 ENCOUNTER — Other Ambulatory Visit: Payer: Self-pay | Admitting: Physician Assistant

## 2022-04-07 ENCOUNTER — Other Ambulatory Visit: Payer: Self-pay

## 2022-04-07 DIAGNOSIS — M7061 Trochanteric bursitis, right hip: Secondary | ICD-10-CM | POA: Diagnosis not present

## 2022-04-07 DIAGNOSIS — M545 Low back pain, unspecified: Secondary | ICD-10-CM | POA: Diagnosis not present

## 2022-04-07 DIAGNOSIS — Z122 Encounter for screening for malignant neoplasm of respiratory organs: Secondary | ICD-10-CM

## 2022-04-07 DIAGNOSIS — E782 Mixed hyperlipidemia: Secondary | ICD-10-CM

## 2022-04-07 DIAGNOSIS — Z720 Tobacco use: Secondary | ICD-10-CM

## 2022-04-07 DIAGNOSIS — I700 Atherosclerosis of aorta: Secondary | ICD-10-CM

## 2022-04-07 MED ORDER — PRAVASTATIN SODIUM 20 MG PO TABS: 20.0000 mg | ORAL_TABLET | Freq: Every day | ORAL | 1 refills | Status: DC

## 2022-04-23 ENCOUNTER — Other Ambulatory Visit: Payer: Self-pay | Admitting: Physician Assistant

## 2022-04-23 DIAGNOSIS — I700 Atherosclerosis of aorta: Secondary | ICD-10-CM

## 2022-04-23 DIAGNOSIS — E1165 Type 2 diabetes mellitus with hyperglycemia: Secondary | ICD-10-CM

## 2022-04-23 DIAGNOSIS — E782 Mixed hyperlipidemia: Secondary | ICD-10-CM

## 2022-04-23 DIAGNOSIS — E559 Vitamin D deficiency, unspecified: Secondary | ICD-10-CM

## 2022-04-24 DIAGNOSIS — N200 Calculus of kidney: Secondary | ICD-10-CM | POA: Insufficient documentation

## 2022-04-24 DIAGNOSIS — J45909 Unspecified asthma, uncomplicated: Secondary | ICD-10-CM | POA: Insufficient documentation

## 2022-04-24 DIAGNOSIS — K5792 Diverticulitis of intestine, part unspecified, without perforation or abscess without bleeding: Secondary | ICD-10-CM | POA: Insufficient documentation

## 2022-04-24 DIAGNOSIS — G43909 Migraine, unspecified, not intractable, without status migrainosus: Secondary | ICD-10-CM | POA: Insufficient documentation

## 2022-04-24 DIAGNOSIS — M1990 Unspecified osteoarthritis, unspecified site: Secondary | ICD-10-CM | POA: Insufficient documentation

## 2022-04-24 DIAGNOSIS — Z8616 Personal history of COVID-19: Secondary | ICD-10-CM | POA: Insufficient documentation

## 2022-04-24 DIAGNOSIS — R12 Heartburn: Secondary | ICD-10-CM | POA: Insufficient documentation

## 2022-04-30 NOTE — Progress Notes (Signed)
Cardiology Office Note:    Date:  05/01/2022   ID:  [name], DOB 1960-09-11, MRN [id number]  PCP:  Marge Duncans, PA-C  Cardiologist:  Shirlee More, MD   Referring MD: Marge Duncans, PA-C  ASSESSMENT:    1. Atherosclerosis of aorta ([place])   2. Mixed hyperlipidemia   3. Type 2 diabetes mellitus with hyperglycemia, without long-term current use of insulin (HCC)    PLAN:    In order of problems listed above:  I reviewed with Sherry Montgomery that she has had good medical care one of the indications for lipid-lowering therapy is the presence of subclinical atherosclerosis she is on pravastatin I told her she may not achieve target LDL less than 70 and if she does not I will switch her to high intensity statin like rosuvastatin and a dose of 20 mg daily and she is pending follow-up labs with her PCP.  Also European guidelines recommending once in a life screening for LP(a) which is a risk enhancer 90 with a next lipid profile.  And statin intolerant bempedoic acid is a great option. I do not think she requires a coronary ischemia evaluation without coronary calcification or ischemic symptoms Encouraged her to fully abstain from cigarette smoking Labile diabetes managed with her PCP  Next appointment after discussion with the patient and her husband I will see back in the office as needed   Medication Adjustments/Labs and Tests Ordered: Current medicines are reviewed at length with the patient today.  Concerns regarding medicines are outlined above.  Orders Placed This Encounter  Procedures   EKG 12-Lead   No orders of the defined types were placed in this encounter.    Chief Complaint  Patient presents with   Thoracic aortic atherosclerosis    History of Present Illness:    Sherry Montgomery is a 62 y.o. female with a history of type 2 diabetes cigarette smoker who is being seen today for the evaluation of thoracic aortic atherosclerosis seen on CT scan at the request of Marge Duncans,  [place].  She had lung cancer screening CT of the chest performed showing aortic atherosclerosis and emphysema.  She has good healthcare knowledge understands atherosclerosis and is compliant with her statin. She has no known history of heart disease congenital rheumatic or atrial fibrillation Despite her cigarette smoking and emphysema on CT she is not short of breath does not have cough or wheezing no edema orthopnea chest pain palpitation or syncope Past Medical History:  Diagnosis Date   Arthritis    Asthma    Diverticulitis    Heartburn    History of COVID-19    Kidney stones    Migraine     Past Surgical History:  Procedure Laterality Date   APPENDECTOMY  1985   disc fusion in neck  2022   [place]  2016   TOTAL ABDOMINAL HYSTERECTOMY  1984    Current Medications: Current Meds  Medication Sig   diazepam (VALIUM) 2 MG tablet Take 1 tablet (2 mg total) by mouth every 6 (six) hours as needed.   fluticasone (FLONASE) 50 MCG/ACT nasal spray Place 2 sprays into both nostrils daily.   Naproxen Sodium (ALEVE PO) Take 1 tablet by mouth daily as needed.   pravastatin (PRAVACHOL) 20 MG tablet Take 1 tablet (20 mg total) by mouth daily.   Vitamin D, Ergocalciferol, (DRISDOL) 1.25 MG (50000 UNIT) CAPS capsule Take 1 capsule (50,000  Units total) by mouth every 7 (seven) days.     Allergies:   Codeine, Shellfish allergy, Ciprofloxacin, Erythromycin, Ibuprofen, Silver nitrate, Sulfa antibiotics, Sulfacetamide, Tramadol, Tramadol hcl, Acetaminophen, Ampicillin, Cefdinir, Cephalexin, Doxycycline, Fish-derived products, Hydrocodone-acetaminophen, and Sulfamethoxazole   Social History   Socioeconomic History   Marital status: Married    Spouse name: Not on file   Number of children: 2   Years of education: Not on file   Highest education level: Not on file  Occupational History   Not on file  Tobacco Use   Smoking status:  Every Day    Packs/day: .5    Types: Cigarettes   Smokeless tobacco: Never  Vaping Use   Vaping Use: Every day  Substance and Sexual Activity   Alcohol use: Never   Drug use: Never   Sexual activity: Yes    Partners: Male  Other Topics Concern   Not on file  Social History Narrative   Not on file   Social Determinants of Health   Financial Resource Strain: Low Risk  (07/01/2021)   Overall Financial Resource Strain (CARDIA)    Difficulty of Paying Living Expenses: Not hard at all  Food Insecurity: No Food Insecurity (07/01/2021)   Hunger Vital Sign    Worried About Running Out of Food in the Last Year: Never true    Ran Out of Food in the Last Year: Never true  Transportation Needs: No Transportation Needs (07/01/2021)   PRAPARE - Hydrologist (Medical): No    Lack of Transportation (Non-Medical): No  Physical Activity: Inactive (07/01/2021)   Exercise Vital Sign    Days of Exercise per Week: 0 days    Minutes of Exercise per Session: 0 min  Stress: No Stress Concern Present (07/01/2021)   [place]    Feeling of Stress : Not at all  Social Connections: Moderately Integrated (07/01/2021)   Social Connection and Isolation Panel [NHANES]    Frequency of Communication with Friends and Family: Three times a week    Frequency of Social Gatherings with Friends and Family: Once a week    Attends Religious Services: More than 4 times per year    Active Member of [employer] or Organizations: No    Attends Music therapist: Never    Marital Status: Married     Family History: The patient's family history includes Alzheimer's disease in her brother, mother, and sister; Leukemia in her sister; Lung cancer in her sister; Lupus in her father.  ROS:   ROS Please see the history of present illness.     All other systems reviewed and are negative.  EKGs/Labs/Other Studies Reviewed:     The following studies were reviewed today: EKG today shows sinus rhythm low voltage otherwise  Recent Labs: 03/23/2022: ALT 16; BUN 12; Creatinine, Ser 0.79; Hemoglobin 15.3; Platelets 240; Potassium 4.1; Sodium 141; TSH 1.740  Recent Lipid Panel    Component Value Date/Time   CHOL 187 03/23/2022 0741   TRIG 107 03/23/2022 0741   HDL 36 (L) 03/23/2022 0741   CHOLHDL 5.2 (H) 03/23/2022 0741   LDLCALC 131 (H) 03/23/2022 0741    Physical Exam:    VS:  BP 100/70 (BP Location: Left Arm, Patient Position: Sitting, Cuff Size: Normal)   Pulse 82   Ht 4\' 11"  (1.499 m)   Wt 177 lb (80.3 kg)   LMP  (LMP Unknown)   SpO2 98%   BMI  35.75 kg/m     Wt Readings from Last 3 Encounters:  05/01/22 177 lb (80.3 kg)  03/27/22 180 lb (81.6 kg)  12/26/21 185 lb 9.6 oz (84.2 kg)     GEN:  Well nourished, well developed in no acute distress no xanthoma or xanthelasma she has a gravelly voice HEENT: Normal NECK: No JVD; No carotid bruits LYMPHATICS: No lymphadenopathy CARDIAC: RRR, no murmurs, rubs, gallops RESPIRATORY:  Clear to auscultation without rales, wheezing or rhonchi  ABDOMEN: Soft, non-tender, non-distended MUSCULOSKELETAL:  No edema; No deformity  SKIN: Warm and dry NEUROLOGIC:  Alert and oriented x 3 PSYCHIATRIC:  Normal affect     Signed, Shirlee More, MD  05/01/2022 10:48 AM    [place]

## 2022-05-01 ENCOUNTER — Ambulatory Visit: Payer: Medicare HMO | Attending: Cardiology | Admitting: Cardiology

## 2022-05-01 ENCOUNTER — Encounter: Payer: Self-pay | Admitting: Cardiology

## 2022-05-01 ENCOUNTER — Telehealth: Payer: Self-pay

## 2022-05-01 VITALS — BP 100/70 | HR 82 | Ht 59.0 in | Wt 177.0 lb

## 2022-05-01 DIAGNOSIS — I700 Atherosclerosis of aorta: Secondary | ICD-10-CM

## 2022-05-01 DIAGNOSIS — E1165 Type 2 diabetes mellitus with hyperglycemia: Secondary | ICD-10-CM | POA: Diagnosis not present

## 2022-05-01 DIAGNOSIS — G5702 Lesion of sciatic nerve, left lower limb: Secondary | ICD-10-CM | POA: Insufficient documentation

## 2022-05-01 DIAGNOSIS — E782 Mixed hyperlipidemia: Secondary | ICD-10-CM

## 2022-05-01 DIAGNOSIS — M5431 Sciatica, right side: Secondary | ICD-10-CM | POA: Insufficient documentation

## 2022-05-01 NOTE — Patient Instructions (Signed)
Medication Instructions:  Your physician recommends that you continue on your current medications as directed. Please refer to the Current Medication list given to you today.  *If you need a refill on your cardiac medications before your next appointment, please call your pharmacy*   Lab Work: None If you have labs (blood work) drawn today and your tests are completely normal, you will receive your results only by: MyChart Message (if you have MyChart) OR A paper copy in the mail If you have any lab test that is abnormal or we need to change your treatment, we will call you to review the results.   Testing/Procedures: None   Follow-Up: At [facility], you and your health needs are our priority.  As part of our continuing mission to provide you with exceptional heart care, we have created designated Provider Care Teams.  These Care Teams include your primary Cardiologist (physician) and Advanced Practice Providers (APPs -  Physician Assistants and Nurse Practitioners) who all work together to provide you with the care you need, when you need it.  We recommend signing up for the patient portal called "MyChart".  Sign up information is provided on this After Visit Summary.  MyChart is used to connect with patients for Virtual Visits (Telemedicine).  Patients are able to view lab/test results, encounter notes, upcoming appointments, etc.  Non-urgent messages can be sent to your provider as well.   To learn more about what you can do with MyChart, go to [email or web address].    Your next appointment:   Follow up as needed  Provider:   Brian Munley, MD    Other Instructions None  

## 2022-05-01 NOTE — Telephone Encounter (Signed)
Patient asked if it was ok for her to take CoQ-10 with her statin medication. I spoke to Dr. Bettina Gavia and he stated that it would be ok for her to take the medication with her statin. Attempted to call the patient and she did not answer the phone and her voice mail box was full so unable to leave a message.

## 2022-05-14 DIAGNOSIS — M5136 Other intervertebral disc degeneration, lumbar region: Secondary | ICD-10-CM | POA: Diagnosis not present

## 2022-05-14 DIAGNOSIS — M47816 Spondylosis without myelopathy or radiculopathy, lumbar region: Secondary | ICD-10-CM | POA: Diagnosis not present

## 2022-05-14 DIAGNOSIS — M545 Low back pain, unspecified: Secondary | ICD-10-CM | POA: Diagnosis not present

## 2022-05-21 DIAGNOSIS — M7062 Trochanteric bursitis, left hip: Secondary | ICD-10-CM | POA: Diagnosis not present

## 2022-05-21 DIAGNOSIS — M7061 Trochanteric bursitis, right hip: Secondary | ICD-10-CM | POA: Diagnosis not present

## 2022-05-21 DIAGNOSIS — M47816 Spondylosis without myelopathy or radiculopathy, lumbar region: Secondary | ICD-10-CM | POA: Diagnosis not present

## 2022-05-21 DIAGNOSIS — M545 Low back pain, unspecified: Secondary | ICD-10-CM | POA: Diagnosis not present

## 2022-06-05 DIAGNOSIS — M47816 Spondylosis without myelopathy or radiculopathy, lumbar region: Secondary | ICD-10-CM | POA: Diagnosis not present

## 2022-06-10 ENCOUNTER — Telehealth: Payer: Self-pay

## 2022-06-10 ENCOUNTER — Other Ambulatory Visit: Payer: Self-pay

## 2022-06-10 DIAGNOSIS — F419 Anxiety disorder, unspecified: Secondary | ICD-10-CM

## 2022-06-10 MED ORDER — DIAZEPAM 2 MG PO TABS: 2.0000 mg | ORAL_TABLET | Freq: Four times a day (QID) | ORAL | 0 refills | Status: DC | PRN

## 2022-06-10 NOTE — Telephone Encounter (Signed)
Patient called stating that she has been on the pravastatin since February and she states that it makes her sleepy and tired all day long and she is pretty sure that it is the pravastatin that is doing it to her. She is requesting if you can change her to a different statin and send in to [facility]. She states she is not going to stop this one unless you decide to change it but she states that she can't stay sleepy like this all the time. Please advise.

## 2022-06-10 NOTE — Telephone Encounter (Signed)
Patient informed, patient states that she will continue to take the medication until next lab work.

## 2022-06-22 ENCOUNTER — Other Ambulatory Visit: Payer: Self-pay | Admitting: Physician Assistant

## 2022-06-22 MED ORDER — NYSTATIN 100000 UNIT/GM EX CREA: 1.0000 | TOPICAL_CREAM | Freq: Two times a day (BID) | CUTANEOUS | 2 refills | Status: DC

## 2022-06-23 ENCOUNTER — Encounter: Payer: Self-pay | Admitting: Physician Assistant

## 2022-06-23 ENCOUNTER — Ambulatory Visit (INDEPENDENT_AMBULATORY_CARE_PROVIDER_SITE_OTHER): Payer: Medicare HMO | Admitting: Physician Assistant

## 2022-06-23 VITALS — BP 98/66 | HR 78 | Temp 96.7°F | Ht 59.0 in | Wt 180.8 lb

## 2022-06-23 DIAGNOSIS — H6503 Acute serous otitis media, bilateral: Secondary | ICD-10-CM | POA: Diagnosis not present

## 2022-06-23 MED ORDER — LEVOFLOXACIN 500 MG PO TABS: 500.0000 mg | ORAL_TABLET | Freq: Every day | ORAL | 0 refills | Status: AC

## 2022-06-23 NOTE — Progress Notes (Signed)
Acute Office Visit  Subjective:    Patient ID: Sherry Montgomery, female    DOB: 1960/05/06, 62 y.o.   MRN: [id number]  Chief Complaint  Patient presents with   Ear Pain    HPI: Patient is in today for complaints of bilateral ear pain and stuffiness for the past 2 weeks - states she has had some sinus pressure as well.  She had surgery and tube placement in both ears about 2 years ago and says right tube fell out about a weeks before her symptoms started.  Denies fever or cough  Current Outpatient Medications:    diazepam (VALIUM) 2 MG tablet, Take 1 tablet (2 mg total) by mouth every 6 (six) hours as needed., Disp: 30 tablet, Rfl: 0   fluticasone (FLONASE) 50 MCG/ACT nasal spray, Place 2 sprays into both nostrils daily., Disp: 16 g, Rfl: 6   levofloxacin (LEVAQUIN) 500 MG tablet, Take 1 tablet (500 mg total) by mouth daily for 7 days., Disp: 7 tablet, Rfl: 0   Naproxen Sodium (ALEVE PO), Take 1 tablet by mouth daily as needed., Disp: , Rfl:    nystatin cream (MYCOSTATIN), Apply 1 Application topically 2 (two) times daily., Disp: 30 g, Rfl: 2   pravastatin (PRAVACHOL) 20 MG tablet, Take 1 tablet (20 mg total) by mouth daily., Disp: 90 tablet, Rfl: 1   Vitamin D, Ergocalciferol, (DRISDOL) 1.25 MG (50000 UNIT) CAPS capsule, Take 1 capsule (50,000 Units total) by mouth every 7 (seven) days., Disp: 5 capsule, Rfl: 5  Allergies  Allergen Reactions   Codeine Anaphylaxis    Throat swelling Throat swelling Throat swelling    Shellfish Allergy Anaphylaxis    Other reaction(s): Shock (ALLERGY) Other reaction(s): Shock (ALLERGY)    Ciprofloxacin Nausea And Vomiting   Erythromycin    Ibuprofen Other (See Comments)    Other reaction(s): Sweating (intolerance) Other reaction(s): Sweating (intolerance)    Silver Nitrate Swelling   Sulfa Antibiotics    Sulfacetamide    Tramadol Other (See Comments)    ask ask ask    Tramadol Hcl    Acetaminophen Rash   Ampicillin Rash   Cefdinir  Rash   Cephalexin Rash   Doxycycline Rash   Fish-Derived Products Rash   Hydrocodone-Acetaminophen Nausea And Vomiting and Rash    ask ask ask    Sulfamethoxazole Rash    ROS CONSTITUTIONAL: Negative for chills, fatigue, fever,  E/N/T: see HPI CARDIOVASCULAR: Negative for chest pain, RESPIRATORY: Negative for recent cough and dyspnea.      Objective:    PHYSICAL EXAM:   BP 98/66 (BP Location: Left Arm, Patient Position: Sitting, Cuff Size: Large)   Pulse 78   Temp (!) 96.7 F (35.9 C) (Temporal)   Ht 4\' 11"  (1.499 m)   Wt 180 lb 12.8 oz (82 kg)   LMP  (LMP Unknown)   SpO2 94%   BMI 36.52 kg/m    GEN: Well nourished, well developed, in no acute distress  HEENT: normal external ears and nose -left TM retracted with fluid noted - tube in ear canal -- right tm with fluid level and mildly discolored - Lips, Teeth and Gums - normal  Oropharynx - normal mucosa, palate, and posterior pharynx Cardiac: RRR; no murmurs,  Respiratory:  normal respiratory rate and pattern with no distress - normal breath sounds with no rales, rhonchi, wheezes or rubs      Assessment & Plan:    Non-recurrent acute serous otitis media of both ears -  levoFLOXacin; Take 1 tablet (500 mg total) by mouth daily for 7 days.  Dispense: 7 tablet; Refill: 0 Recommend salt water gargles, use flonase and claritin    Follow-up: Return if symptoms worsen or fail to improve.  An After Visit Summary was printed and given to the patient.  Jettie Pagan [facility] [phone]

## 2022-06-24 ENCOUNTER — Telehealth: Payer: Self-pay

## 2022-06-24 NOTE — Telephone Encounter (Signed)
Patient called and needed handicap plate form filled out because the one she have will expire soon.  Patient made aware form is ready for pick up.

## 2022-07-06 DIAGNOSIS — M47816 Spondylosis without myelopathy or radiculopathy, lumbar region: Secondary | ICD-10-CM | POA: Diagnosis not present

## 2022-07-09 DIAGNOSIS — R198 Other specified symptoms and signs involving the digestive system and abdomen: Secondary | ICD-10-CM | POA: Diagnosis not present

## 2022-07-09 DIAGNOSIS — K5790 Diverticulosis of intestine, part unspecified, without perforation or abscess without bleeding: Secondary | ICD-10-CM | POA: Diagnosis not present

## 2022-07-09 DIAGNOSIS — K760 Fatty (change of) liver, not elsewhere classified: Secondary | ICD-10-CM | POA: Diagnosis not present

## 2022-07-20 ENCOUNTER — Encounter: Payer: Self-pay | Admitting: Cardiology

## 2022-07-20 ENCOUNTER — Telehealth: Payer: Self-pay | Admitting: Cardiology

## 2022-07-20 NOTE — Progress Notes (Signed)
 /  Cardiology Office Note:    Date:  07/21/2022   ID:  Sherry Montgomery, DOB 1960-09-08, MRN [id number]  PCP:  Sherry Credit, PA-C   [facility] Providers Cardiologist:  None     Referring MD: Sherry Credit, PA-C    CC: follow up SOB  History of Present Illness:    Sherry Montgomery is a 62 y.o. female with a hx of thoracic aortic atherosclerosis noted on CT imaging, fibromyalgia, prediabetes, recurrent sinusitis, hyperlipidemia, tobacco abuse, emphysema.  She establish care with Dr. Monetta on 05/01/2022 at the behest of her PCP after recent CT scan of her chest revealed thoracic aorta atherosclerosis.  Most recent LDL is elevated at 131, currently on pravastatin  20 mg daily.  She presents today with concerns of shortness of breath and fatigue.  She states this been going on for a few weeks, comes and goes, she cannot correlate it with any particular activity.  She continues to smoke however she has decreased her tobacco intake down drastically, only smoking 5 cigarettes a day, she is vaping with nicotine  but voices motivation for complete cessation of both.  Previously she was evaluated in [place]  for suspected OSA, however it appears she was lost to follow-up and ultimately moved down here.  She denies chest pain, palpitations, dyspnea, pnd, orthopnea, n, v, dizziness, syncope, edema, weight gain, or early satiety.    Past Medical History:  Diagnosis Date   Arthritis    Asthma    Diverticulitis    Emphysema, unspecified (HCC) 2024   Noted on CT imaging 2024   Heartburn    History of COVID-19    Kidney stones    Migraine    Prediabetes    Thoracic aortic atherosclerosis (HCC) 2024   Noted on CT imaging 2024   Tobacco abuse     Past Surgical History:  Procedure Laterality Date   APPENDECTOMY  1985   disc fusion in neck  2022   GALLBLADDER SURGERY  1993   HERNIA REPAIR  1979   ROTATOR CUFF REPAIR  2016   TOTAL ABDOMINAL HYSTERECTOMY  1984    Current  Medications: Current Meds  Medication Sig   diazepam  (VALIUM ) 2 MG tablet Take 1 tablet (2 mg total) by mouth every 6 (six) hours as needed.   loratadine (CLARITIN) 10 MG tablet Take 10 mg by mouth daily as needed for allergies.   Naproxen Sodium (ALEVE PO) Take 1 tablet by mouth daily as needed.   nystatin  cream (MYCOSTATIN ) Apply 1 Application topically 2 (two) times daily.   pravastatin  (PRAVACHOL ) 20 MG tablet Take 1 tablet (20 mg total) by mouth daily.     Allergies:   Codeine, Shellfish allergy, Ciprofloxacin, Erythromycin, Ibuprofen, Silver nitrate, Sulfa antibiotics, Sulfacetamide, Tramadol, Tramadol hcl, Acetaminophen, Ampicillin, Cefdinir, Cephalexin, Doxycycline, Fish-derived products, Hydrocodone-acetaminophen, and Sulfamethoxazole   Social History   Socioeconomic History   Marital status: Married    Spouse name: Not on file   Number of children: 2   Years of education: Not on file   Highest education level: Not on file  Occupational History   Not on file  Tobacco Use   Smoking status: Every Day    Packs/day: .5    Types: Cigarettes   Smokeless tobacco: Never  Vaping Use   Vaping Use: Every day  Substance and Sexual Activity   Alcohol use: Never   Drug use: Never   Sexual activity: Yes    Partners: Male  Other Topics Concern  Not on file  Social History Narrative   Not on file   Social Determinants of Health   Financial Resource Strain: Low Risk  (07/01/2021)   Overall Financial Resource Strain (CARDIA)    Difficulty of Paying Living Expenses: Not hard at all  Food Insecurity: No Food Insecurity (07/01/2021)   Hunger Vital Sign    Worried About Running Out of Food in the Last Year: Never true    Ran Out of Food in the Last Year: Never true  Transportation Needs: No Transportation Needs (07/01/2021)   PRAPARE - Administrator, Civil Service (Medical): No    Lack of Transportation (Non-Medical): No  Physical Activity: Inactive (07/01/2021)    Exercise Vital Sign    Days of Exercise per Week: 0 days    Minutes of Exercise per Session: 0 min  Stress: No Stress Concern Present (07/01/2021)   [employer] of Occupational Health - Occupational Stress Questionnaire    Feeling of Stress : Not at all  Social Connections: Moderately Integrated (07/01/2021)   Social Connection and Isolation Panel [NHANES]    Frequency of Communication with Friends and Family: Three times a week    Frequency of Social Gatherings with Friends and Family: Once a week    Attends Religious Services: More than 4 times per year    Active Member of [employer] or Organizations: No    Attends Engineer, Structural: Never    Marital Status: Married     Family History: The patient's family history includes Alzheimer's disease in her brother, mother, and sister; Leukemia in her sister; Lung cancer in her sister; Lupus in her father.  ROS:   Please see the history of present illness.     All other systems reviewed and are negative.  EKGs/Labs/Other Studies Reviewed:    The following studies were reviewed today:      EKG:  EKG is not ordered today.  Recent Labs: 03/23/2022: ALT 16; BUN 12; Creatinine, Ser 0.79; Hemoglobin 15.3; Platelets 240; Potassium 4.1; Sodium 141; TSH 1.740  Recent Lipid Panel    Component Value Date/Time   CHOL 187 03/23/2022 0741   TRIG 107 03/23/2022 0741   HDL 36 (L) 03/23/2022 0741   CHOLHDL 5.2 (H) 03/23/2022 0741   LDLCALC 131 (H) 03/23/2022 0741     Risk Assessment/Calculations:           STOP-Bang Score:  4       Physical Exam:    VS:  BP 94/70 (BP Location: Left Arm, Patient Position: Sitting, Cuff Size: Normal)   Pulse 62   Ht 4' 11 (1.499 m)   Wt 181 lb (82.1 kg)   LMP  (LMP Unknown)   SpO2 97%   BMI 36.56 kg/m     Wt Readings from Last 3 Encounters:  07/21/22 181 lb (82.1 kg)  06/23/22 180 lb 12.8 oz (82 kg)  05/01/22 177 lb (80.3 kg)     GEN:  Well nourished, well developed in no acute  distress HEENT: Normal NECK: No JVD; No carotid bruits LYMPHATICS: No lymphadenopathy CARDIAC: RRR, no murmurs, rubs, gallops RESPIRATORY:  Clear to auscultation without rales, wheezing or rhonchi  ABDOMEN: Soft, non-tender, non-distended MUSCULOSKELETAL:  No edema; No deformity  SKIN: Warm and dry NEUROLOGIC:  Alert and oriented x 3 PSYCHIATRIC:  Normal affect   ASSESSMENT:    1. Atherosclerosis of aorta (HCC)   2. Mixed hyperlipidemia   3. Shortness of breath   4. Snoring  5. Tobacco abuse    PLAN:    In order of problems listed above:  Atherosclerosis of aorta-incidental noted on CT imaging, Stable with no anginal symptoms. No indication for ischemic evaluation.  Heart healthy diet and regular cardiovascular exercise encouraged, however she has neck and back pain that prohibits exercise currently.  SOB/fatigue - does not typically with exertion, does not appear to be volume overloaded, will start with echocardiogram as she does not have one on file. Recent imaging suggestive of emphysema.  Snoring -husband states that she snores very loudly, previously began the process for workup of OSA many years ago however relocated and was lost to follow-up.  STOP-BANG score of 4, will arrange for home [name].  Mixed hyperlipidemia -recent LDL is elevated at 131, She was to start her on pravastatin  by her PCP a few months ago and will be seeing her in a few weeks for repeat testing. Tobacco abuse - she has cut down from 1 PPD > 5 cigarettes/day and vaping with nicotine . We discussed options to help with complete cessation, however she has been relatively successful with decreasing her tobacco intake and feels like she can quit on her own.   Disposition-echocardiogram for shortness of breath, [name] for evaluation of OSA.           Medication Adjustments/Labs and Tests Ordered: Current medicines are reviewed at length with the patient today.  Concerns regarding medicines are outlined  above.  Orders Placed This Encounter  Procedures   EKG 12-Lead   ECHOCARDIOGRAM COMPLETE   [name] Sleep Study   No orders of the defined types were placed in this encounter.   Patient Instructions  Medication Instructions:  Your physician recommends that you continue on your current medications as directed. Please refer to the Current Medication list given to you today.  *If you need a refill on your cardiac medications before your next appointment, please call your pharmacy*   Lab Work: None Ordered If you have labs (blood work) drawn today and your tests are completely normal, you will receive your results only by: MyChart Message (if you have MyChart) OR A paper copy in the mail If you have any lab test that is abnormal or we need to change your treatment, we will call you to review the results.   Testing/Procedures: Your physician has requested that you have an echocardiogram. Echocardiography is a painless test that uses sound waves to create images of your heart. It provides your doctor with information about the size and shape of your heart and how well your hearts chambers and valves are working. This procedure takes approximately one hour. There are no restrictions for this procedure. Please do NOT wear cologne, perfume, aftershave, or lotions (deodorant is allowed). Please arrive 15 minutes prior to your appointment time.   [name] Sleep Study- Will call when insurance authorizes   Follow-Up: At [facility], you and your health needs are our priority.  As part of our continuing mission to provide you with exceptional heart care, we have created designated Provider Care Teams.  These Care Teams include your primary Cardiologist (physician) and Advanced Practice Providers (APPs -  Physician Assistants and Nurse Practitioners) who all work together to provide you with the care you need, when you need it.  We recommend signing up for the patient portal called MyChart.   Sign up information is provided on this After Visit Summary.  MyChart is used to connect with patients for Virtual Visits (Telemedicine).  Patients are able  to view lab/test results, encounter notes, upcoming appointments, etc.  Non-urgent messages can be sent to your provider as well.   To learn more about what you can do with MyChart, go to [email or web address].    Your next appointment:   3 month(s)  The format for your next appointment:   In Person  Provider:   Lamar Fitch, MD    Other Instructions NA    Signed, Delon JAYSON Hoover, NP  07/21/2022 3:21 PM    [facility]

## 2022-07-20 NOTE — Telephone Encounter (Signed)
Pt c/o Shortness Of Breath: STAT if SOB developed within the last 24 hours or pt is noticeably SOB on the phone  1. Are you currently SOB (can you hear that pt is SOB on the phone)? No   2. How long have you been experiencing SOB? A couple days ago   3. Are you SOB when sitting or when up moving around? Both, more so when moving around   4. Are you currently experiencing any other symptoms? Pain in chest under collar bones    SOB comes and goes with and without exertion, but does not occur all the time. CP has been off and on for 3 weeks. Denied any symptoms at time of call.   Please advise.

## 2022-07-20 NOTE — Telephone Encounter (Signed)
Spoke with pt. She stated that she has noticed some increased shortness of breath over the lasdt 2 weeks and this is new. Recommended that she make appt to be seen. Pt agreed. Sent to front desk for appt.

## 2022-07-21 ENCOUNTER — Encounter: Payer: Self-pay | Admitting: Cardiology

## 2022-07-21 ENCOUNTER — Encounter (INDEPENDENT_AMBULATORY_CARE_PROVIDER_SITE_OTHER): Payer: Medicare HMO | Admitting: Cardiology

## 2022-07-21 ENCOUNTER — Ambulatory Visit: Payer: Medicare HMO | Attending: Cardiology | Admitting: Cardiology

## 2022-07-21 ENCOUNTER — Telehealth: Payer: Self-pay

## 2022-07-21 VITALS — BP 94/70 | HR 62 | Ht 59.0 in | Wt 181.0 lb

## 2022-07-21 DIAGNOSIS — R0602 Shortness of breath: Secondary | ICD-10-CM | POA: Diagnosis not present

## 2022-07-21 DIAGNOSIS — I700 Atherosclerosis of aorta: Secondary | ICD-10-CM

## 2022-07-21 DIAGNOSIS — R0683 Snoring: Secondary | ICD-10-CM

## 2022-07-21 DIAGNOSIS — Z720 Tobacco use: Secondary | ICD-10-CM | POA: Diagnosis not present

## 2022-07-21 DIAGNOSIS — E782 Mixed hyperlipidemia: Secondary | ICD-10-CM

## 2022-07-21 NOTE — Telephone Encounter (Signed)
**Note De-identified Bastion Bolger Obfuscation** -----  **Note De-Identified Mort Smelser Obfuscation** Message from Neena Rhymes, RN sent at 07/21/2022 10:15 AM EDT ----- Pt needs Thayer Jew. TY Dede

## 2022-07-21 NOTE — Telephone Encounter (Signed)
**Note De-Identified Sherry Montgomery Obfuscation** 6/4-READY-No PA REQ for [name].

## 2022-07-21 NOTE — Patient Instructions (Addendum)
Medication Instructions:  Your physician recommends that you continue on your current medications as directed. Please refer to the Current Medication list given to you today.  *If you need a refill on your cardiac medications before your next appointment, please call your pharmacy*   Lab Work: None Ordered If you have labs (blood work) drawn today and your tests are completely normal, you will receive your results only by: MyChart Message (if you have MyChart) OR A paper copy in the mail If you have any lab test that is abnormal or we need to change your treatment, we will call you to review the results.   Testing/Procedures: Your physician has requested that you have an echocardiogram. Echocardiography is a painless test that uses sound waves to create images of your heart. It provides your doctor with information about the size and shape of your heart and how well your heart's chambers and valves are working. This procedure takes approximately one hour. There are no restrictions for this procedure. Please do NOT wear cologne, perfume, aftershave, or lotions (deodorant is allowed). Please arrive 15 minutes prior to your appointment time.   [name] Sleep Study- Will call when insurance authorizes   Follow-Up: At [facility], you and your health needs are our priority.  As part of our continuing mission to provide you with exceptional heart care, we have created designated Provider Care Teams.  These Care Teams include your primary Cardiologist (physician) and Advanced Practice Providers (APPs -  Physician Assistants and Nurse Practitioners) who all work together to provide you with the care you need, when you need it.  We recommend signing up for the patient portal called "MyChart".  Sign up information is provided on this After Visit Summary.  MyChart is used to connect with patients for Virtual Visits (Telemedicine).  Patients are able to view lab/test results, encounter notes, upcoming  appointments, etc.  Non-urgent messages can be sent to your provider as well.   To learn more about what you can do with MyChart, go to [email or web address].    Your next appointment:   3 month(s)  The format for your next appointment:   In Person  Provider:   Gypsy Balsam, MD    Other Instructions NA

## 2022-07-22 ENCOUNTER — Ambulatory Visit
Admission: RE | Admit: 2022-07-22 | Discharge: 2022-07-22 | Disposition: A | Discharge status: Home / Self Care | Payer: Medicare HMO | Source: Ambulatory Visit | Attending: Physician Assistant | Admitting: Physician Assistant

## 2022-07-22 DIAGNOSIS — Z1231 Encounter for screening mammogram for malignant neoplasm of breast: Secondary | ICD-10-CM | POA: Diagnosis not present

## 2022-07-24 ENCOUNTER — Telehealth: Payer: Self-pay | Admitting: Cardiology

## 2022-07-24 NOTE — Telephone Encounter (Signed)
Patient called to follow-up on her test results.  Patient noted that she cannot see results in MyChart if they are in .pdf format.

## 2022-07-24 NOTE — Telephone Encounter (Signed)
Patient is returning call she states and would like a call back.

## 2022-07-24 NOTE — Telephone Encounter (Signed)
Pt called regarding sleep study results. Advised that results would go to Dr. Mayford Knife and they would call and address the need for a CPAP. Pt verbalized understanding and had no further questions.

## 2022-07-28 ENCOUNTER — Ambulatory Visit: Payer: Medicare HMO | Attending: Cardiology

## 2022-07-28 DIAGNOSIS — R0602 Shortness of breath: Secondary | ICD-10-CM

## 2022-07-28 LAB — ECHOCARDIOGRAM COMPLETE
Area-P 1/2: 5.38 cm2
S' Lateral: 2.7 cm

## 2022-07-28 NOTE — Procedures (Signed)
   SLEEP STUDY REPORT Patient Information Study Date: 07/21/2022 Patient Name: Sherry Montgomery Patient ID: [id number] Birth Date: [birth year] Age: 62 Gender: Female BMI: 36.4 (W=181 lb, H=4' 11'') Referring Physician: Gypsy Balsam, MD  TEST DESCRIPTION: Home sleep apnea testing was completed using the WatchPat, a Type 1 device, utilizing peripheral arterial tonometry (PAT), chest movement, actigraphy, pulse oximetry, pulse rate, body position and snore. AHI was calculated with apnea and hypopnea using valid sleep time as the denominator. RDI includes apneas, hypopneas, and RERAs. The data acquired and the scoring of sleep and all associated events were performed in accordance with the recommended standards and specifications as outlined in the AASM Manual for the Scoring of Sleep and Associated Events 2.2.0 (2015).   FINDINGS: 1.  No evidence of Obstructive Sleep Apnea with AHI 1.4/hr.  2.  No Central Sleep Apnea. 3.  Oxygen desaturations as low as 84%. 4.  Mild snoring was present. O2 sats were < 88% for 0.1 minutes. 5.  Total sleep time was 4 hrs and 58 min. 6.  16.9% of total sleep time was spent in REM sleep.  7.  Normal sleep onset latency at 22 min.  8.  Shortened REM sleep onset latency at 82 min.  9.  Total awakenings were 10.   DIAGNOSIS:  Normal study with no significant sleep disordered breathing.  RECOMMENDATIONS:   1. Normal study with no significant sleep disordered breathing.  2.  Healthy sleep recommendations include:  adequate nightly sleep (normal 7-9 hrs/night), avoidance of caffeine after noon and alcohol near bedtime, and maintaining a sleep environment that is cool, dark and quiet.  3.  Weight loss for overweight patients is recommended.    4.  Snoring recommendations include:  weight loss where appropriate, side sleeping, and avoidance of alcohol before bed.  5.  Operation of motor vehicle or dangerous equipment must be avoided when feeling drowsy,  excessively sleepy, or mentally fatigued.    6.  An ENT consultation which may be useful for specific causes of and possible treatment of bothersome snoring.   7. Weight loss may be of benefit in reducing the severity of snoring.   Signature: Armanda Magic, MD; [facility]; Diplomat, American Board of Sleep Medicine Electronically Signed: 07/28/2022 10:08:36 AM

## 2022-07-30 ENCOUNTER — Telehealth: Payer: Self-pay | Admitting: Cardiology

## 2022-07-30 ENCOUNTER — Telehealth: Payer: Self-pay | Admitting: *Deleted

## 2022-07-30 ENCOUNTER — Other Ambulatory Visit: Payer: Medicare HMO

## 2022-07-30 DIAGNOSIS — E559 Vitamin D deficiency, unspecified: Secondary | ICD-10-CM | POA: Diagnosis not present

## 2022-07-30 DIAGNOSIS — E782 Mixed hyperlipidemia: Secondary | ICD-10-CM

## 2022-07-30 DIAGNOSIS — I700 Atherosclerosis of aorta: Secondary | ICD-10-CM | POA: Diagnosis not present

## 2022-07-30 DIAGNOSIS — E1165 Type 2 diabetes mellitus with hyperglycemia: Secondary | ICD-10-CM

## 2022-07-30 NOTE — Telephone Encounter (Signed)
-----   Message from Quintella Reichert, MD sent at 07/28/2022 10:10 AM EDT ----- Please let patient know that sleep study showed no significant sleep apnea.

## 2022-07-30 NOTE — Telephone Encounter (Signed)
Mailbox is full.  Echo shows normal squeezing and normal relaxing. Mild leaking of your mitral valve, but this is not abnormal as we age. This would not be causing you any symptoms. These results are great!

## 2022-07-30 NOTE — Telephone Encounter (Signed)
The patient has been notified of the result and verbalized understanding.  All questions (if any) were answered. Latrelle Dodrill, CMA 07/30/2022 4:32 PM    Pt is aware and agreeable to normal results.

## 2022-07-30 NOTE — Telephone Encounter (Signed)
Informed pt of echo results, pt verbalized understanding and had no questions.

## 2022-07-31 ENCOUNTER — Other Ambulatory Visit: Payer: Self-pay | Admitting: Physician Assistant

## 2022-07-31 DIAGNOSIS — E559 Vitamin D deficiency, unspecified: Secondary | ICD-10-CM

## 2022-07-31 LAB — VITAMIN D 25 HYDROXY (VIT D DEFICIENCY, FRACTURES): Vit D, 25-Hydroxy: 18.9 ng/mL — ABNORMAL LOW (ref 30.0–100.0)

## 2022-07-31 LAB — LIPID PANEL
Chol/HDL Ratio: 3.9 ratio (ref 0.0–4.4)
Cholesterol, Total: 154 mg/dL (ref 100–199)
HDL: 39 mg/dL — ABNORMAL LOW (ref >39)
LDL Chol Calc (NIH): 85 mg/dL (ref 0–99)
Triglycerides: 174 mg/dL — ABNORMAL HIGH (ref 0–149)
VLDL Cholesterol Cal: 30 mg/dL (ref 5–40)

## 2022-07-31 LAB — CBC WITH DIFFERENTIAL/PLATELET
Basophils Absolute: 0.1 10*3/uL (ref 0.0–0.2)
Basos: 1 %
EOS (ABSOLUTE): 0.3 10*3/uL (ref 0.0–0.4)
Eos: 4 %
Hematocrit: 45.9 % (ref 34.0–46.6)
Hemoglobin: 15 g/dL (ref 11.1–15.9)
Immature Grans (Abs): 0 10*3/uL (ref 0.0–0.1)
Immature Granulocytes: 0 %
Lymphocytes Absolute: 3.1 10*3/uL (ref 0.7–3.1)
Lymphs: 37 %
MCH: 31.1 pg (ref 26.6–33.0)
MCHC: 32.7 g/dL (ref 31.5–35.7)
MCV: 95 fL (ref 79–97)
Monocytes Absolute: 0.9 10*3/uL (ref 0.1–0.9)
Monocytes: 10 %
Neutrophils Absolute: 4.1 10*3/uL (ref 1.4–7.0)
Neutrophils: 48 %
Platelets: 276 10*3/uL (ref 150–450)
RBC: 4.83 x10E6/uL (ref 3.77–5.28)
RDW: 11 % — ABNORMAL LOW (ref 11.7–15.4)
WBC: 8.5 10*3/uL (ref 3.4–10.8)

## 2022-07-31 LAB — COMPREHENSIVE METABOLIC PANEL
ALT: 45 IU/L — ABNORMAL HIGH (ref 0–32)
AST: 38 IU/L (ref 0–40)
Albumin/Globulin Ratio: 1.3
Albumin: 4 g/dL (ref 3.8–4.9)
Alkaline Phosphatase: 73 IU/L (ref 44–121)
BUN/Creatinine Ratio: 16 (ref 12–28)
BUN: 16 mg/dL (ref 8–27)
Bilirubin Total: 0.2 mg/dL (ref 0.0–1.2)
CO2: 22 mmol/L (ref 20–29)
Calcium: 9.3 mg/dL (ref 8.7–10.3)
Chloride: 104 mmol/L (ref 96–106)
Creatinine, Ser: 1.03 mg/dL — ABNORMAL HIGH (ref 0.57–1.00)
Globulin, Total: 3.1 g/dL (ref 1.5–4.5)
Glucose: 104 mg/dL — ABNORMAL HIGH (ref 70–99)
Potassium: 4.8 mmol/L (ref 3.5–5.2)
Sodium: 140 mmol/L (ref 134–144)
Total Protein: 7.1 g/dL (ref 6.0–8.5)
eGFR: 62 mL/min/{1.73_m2} (ref >59)

## 2022-07-31 LAB — HEMOGLOBIN A1C
Est. average glucose Bld gHb Est-mCnc: 128 mg/dL
Hgb A1c MFr Bld: 6.1 % — ABNORMAL HIGH (ref 4.8–5.6)

## 2022-07-31 MED ORDER — VITAMIN D (ERGOCALCIFEROL) 1.25 MG (50000 UNIT) PO CAPS
50000.0000 [IU] | ORAL_CAPSULE | ORAL | 5 refills | Status: DC
Start: 2022-07-31 — End: 2023-09-02

## 2022-08-03 ENCOUNTER — Encounter: Payer: Self-pay | Admitting: Physician Assistant

## 2022-08-03 ENCOUNTER — Ambulatory Visit (INDEPENDENT_AMBULATORY_CARE_PROVIDER_SITE_OTHER): Payer: Medicare HMO | Admitting: Physician Assistant

## 2022-08-03 VITALS — BP 90/62 | HR 67 | Temp 97.2°F | Ht 59.0 in | Wt 181.0 lb

## 2022-08-03 DIAGNOSIS — E782 Mixed hyperlipidemia: Secondary | ICD-10-CM | POA: Diagnosis not present

## 2022-08-03 DIAGNOSIS — F419 Anxiety disorder, unspecified: Secondary | ICD-10-CM | POA: Diagnosis not present

## 2022-08-03 DIAGNOSIS — E559 Vitamin D deficiency, unspecified: Secondary | ICD-10-CM

## 2022-08-03 DIAGNOSIS — E1165 Type 2 diabetes mellitus with hyperglycemia: Secondary | ICD-10-CM | POA: Diagnosis not present

## 2022-08-03 NOTE — Progress Notes (Signed)
Established Patient Office Visit  Subjective:  Patient ID: Sherry Montgomery, female    DOB: January 18, 1961  Age: 62 y.o. MRN: [id number]  CC:  Chief Complaint  Patient presents with   Medical Management of Chronic Issues    HPI Sherry Montgomery presents for chronic follow up  Pt with history of diabetes - had hgb A1c of 6.1 recently  She has currently managed with diet - is trying to decrease carbs/sugars Voices no concerns or problems  Pt with diagnosis of vit D def - recently had labwork that showed Vit d low - recently started weekly vit D  Pt with history of anxiety - rarely uses valium as needed - voices no problems  Mixed hyperlipidemia  Pt presents with hyperlipidemia. Compliance with treatment has been good The patient is compliant with medications, maintains a low cholesterol diet , follows up as directed , and maintains an exercise regimen . The patient denies experiencing any hypercholesterolemia related symptoms. Currently taking crestor 20mg  qd   Past Medical History:  Diagnosis Date   Arthritis    Asthma    Diverticulitis    Emphysema, unspecified (HCC) 2024   Noted on CT imaging 2024   Heartburn    History of COVID-19    Kidney stones    Migraine    Prediabetes    Thoracic aortic atherosclerosis (HCC) 2024   Noted on CT imaging 2024   Tobacco abuse     Past Surgical History:  Procedure Laterality Date   APPENDECTOMY  1985   disc fusion in neck  2022   GALLBLADDER SURGERY  1993   HERNIA REPAIR  1979   ROTATOR CUFF REPAIR  2016   TOTAL ABDOMINAL HYSTERECTOMY  1984    Family History  Problem Relation Age of Onset   Alzheimer's disease Mother    Lupus Father    Lung cancer Sister    Leukemia Sister    Alzheimer's disease Sister    Alzheimer's disease Brother    Breast cancer Neg Hx     Social History   Socioeconomic History   Marital status: Married    Spouse name: Not on file   Number of children: 2   Years of education: Not on file    Highest education level: Not on file  Occupational History   Not on file  Tobacco Use   Smoking status: Every Day    Packs/day: .5    Types: Cigarettes   Smokeless tobacco: Never  Vaping Use   Vaping Use: Every day  Substance and Sexual Activity   Alcohol use: Never   Drug use: Never   Sexual activity: Yes    Partners: Male  Other Topics Concern   Not on file  Social History Narrative   Not on file   Social Determinants of Health   Financial Resource Strain: Low Risk  (07/01/2021)   Overall Financial Resource Strain (CARDIA)    Difficulty of Paying Living Expenses: Not hard at all  Food Insecurity: No Food Insecurity (07/01/2021)   Hunger Vital Sign    Worried About Running Out of Food in the Last Year: Never true    Ran Out of Food in the Last Year: Never true  Transportation Needs: No Transportation Needs (07/01/2021)   PRAPARE - Administrator, Civil Service (Medical): No    Lack of Transportation (Non-Medical): No  Physical Activity: Inactive (07/01/2021)   Exercise Vital Sign    Days of Exercise per Week: 0 days  Minutes of Exercise per Session: 0 min  Stress: No Stress Concern Present (07/01/2021)   [employer] of Occupational Health - Occupational Stress Questionnaire    Feeling of Stress : Not at all  Social Connections: Moderately Integrated (07/01/2021)   Social Connection and Isolation Panel [NHANES]    Frequency of Communication with Friends and Family: Three times a week    Frequency of Social Gatherings with Friends and Family: Once a week    Attends Religious Services: More than 4 times per year    Active Member of [employer] or Organizations: No    Attends Banker Meetings: Never    Marital Status: Married  Catering manager Violence: Not At Risk (07/01/2021)   Humiliation, Afraid, Rape, and Kick questionnaire    Fear of Current or Ex-Partner: No    Emotionally Abused: No    Physically Abused: No    Sexually Abused: No      Current Outpatient Medications:    diazepam (VALIUM) 2 MG tablet, Take 1 tablet (2 mg total) by mouth every 6 (six) hours as needed., Disp: 30 tablet, Rfl: 0   loratadine (CLARITIN) 10 MG tablet, Take 10 mg by mouth daily as needed for allergies., Disp: , Rfl:    Naproxen Sodium (ALEVE PO), Take 1 tablet by mouth daily as needed., Disp: , Rfl:    nystatin cream (MYCOSTATIN), Apply 1 Application topically 2 (two) times daily., Disp: 30 g, Rfl: 2   pravastatin (PRAVACHOL) 20 MG tablet, Take 1 tablet (20 mg total) by mouth daily., Disp: 90 tablet, Rfl: 1   Vitamin D, Ergocalciferol, (DRISDOL) 1.25 MG (50000 UNIT) CAPS capsule, Take 1 capsule (50,000 Units total) by mouth every 7 (seven) days., Disp: 5 capsule, Rfl: 5   Allergies  Allergen Reactions   Codeine Anaphylaxis    Throat swelling Throat swelling Throat swelling    Shellfish Allergy Anaphylaxis    Other reaction(s): Shock (ALLERGY) Other reaction(s): Shock (ALLERGY)    Ciprofloxacin Nausea And Vomiting   Erythromycin    Ibuprofen Other (See Comments)    Other reaction(s): Sweating (intolerance) Other reaction(s): Sweating (intolerance)    Silver Nitrate Swelling   Sulfa Antibiotics    Sulfacetamide    Tramadol Other (See Comments)    ask ask ask    Tramadol Hcl    Acetaminophen Rash   Ampicillin Rash   Cefdinir Rash   Cephalexin Rash   Doxycycline Rash   Fish-Derived Products Rash   Hydrocodone-Acetaminophen Nausea And Vomiting and Rash    ask ask ask    Sulfamethoxazole Rash    CONSTITUTIONAL: Negative for chills, fatigue, fever, unintentional weight gain and unintentional weight loss.  E/N/T: Negative for ear pain, nasal congestion and sore throat.  CARDIOVASCULAR: Negative for chest pain, dizziness, palpitations and pedal edema.  RESPIRATORY: Negative for recent cough and dyspnea.  GASTROINTESTINAL: Negative for abdominal pain, acid reflux symptoms, constipation, diarrhea, nausea and vomiting.   MSK: Negative for arthralgias and myalgias.  INTEGUMENTARY: Negative for rash.  NEUROLOGICAL: Negative for dizziness and headaches.  PSYCHIATRIC: Negative for sleep disturbance and to question depression screen.  Negative for depression, negative for anhedonia.        Objective:    PHYSICAL EXAM:   VS: BP 90/62 (BP Location: Left Arm, Patient Position: Sitting, Cuff Size: Large)   Pulse 67   Temp (!) 97.2 F (36.2 C) (Temporal)   Ht 4\' 11"  (1.499 m)   Wt 181 lb (82.1 kg)   LMP  (LMP Unknown)  SpO2 96%   BMI 36.56 kg/m   GEN: Well nourished, well developed, in no acute distress  Cardiac: RRR; no murmurs, rubs, or gallops,no edema -  Respiratory:  normal respiratory rate and pattern with no distress - normal breath sounds with no rales, rhonchi, wheezes or rubs GI: normal bowel sounds, no masses or tenderness MS: no deformity or atrophy  Skin: warm and dry, no rash  Psych: euthymic mood, appropriate affect and demeanor    Health Maintenance Due  Topic Date Due   OPHTHALMOLOGY EXAM  Never done    There are no preventive care reminders to display for this patient.  Lab Results  Component Value Date   TSH 1.740 03/23/2022   Lab Results  Component Value Date   WBC 8.5 07/30/2022   HGB 15.0 07/30/2022   HCT 45.9 07/30/2022   MCV 95 07/30/2022   PLT 276 07/30/2022   Lab Results  Component Value Date   NA 140 07/30/2022   K 4.8 07/30/2022   CO2 22 07/30/2022   GLUCOSE 104 (H) 07/30/2022   BUN 16 07/30/2022   CREATININE 1.03 (H) 07/30/2022   BILITOT 0.2 07/30/2022   ALKPHOS 73 07/30/2022   AST 38 07/30/2022   ALT 45 (H) 07/30/2022   PROT 7.1 07/30/2022   ALBUMIN 4.0 07/30/2022   CALCIUM 9.3 07/30/2022   EGFR 62 07/30/2022   Lab Results  Component Value Date   CHOL 154 07/30/2022   Lab Results  Component Value Date   HDL 39 (L) 07/30/2022   Lab Results  Component Value Date   LDLCALC 85 07/30/2022   Lab Results  Component Value Date   TRIG 174  (H) 07/30/2022   Lab Results  Component Value Date   CHOLHDL 3.9 07/30/2022   Lab Results  Component Value Date   HGBA1C 6.1 (H) 07/30/2022      Assessment & Plan:   Problem List Items Addressed This Visit   None Visit Diagnoses     Type 2 diabetes mellitus with hyperglycemia, without long-term current use of insulin (HCC)    -  Primary Continue to watch diet   Mixed hyperlipidemia     Watch diet Pt taking crestor 20mg  qd   Vitamin D insufficiency     Start weekly supplement as directed  Anxiety Use valium prn                                           No orders of the defined types were placed in this encounter.   Follow-up: Return in about 6 months (around 02/02/2023) for chronic fasting follow-up.    SARA R Loryn Haacke, PA-C

## 2022-08-04 DIAGNOSIS — M47816 Spondylosis without myelopathy or radiculopathy, lumbar region: Secondary | ICD-10-CM | POA: Diagnosis not present

## 2022-08-12 IMAGING — MG MM DIGITAL SCREENING BILAT W/ TOMO AND CAD
8 series · 8 of 24 positions shown · non-contrast
Comparison: Previous exam(s).

CLINICAL DATA: Screening.

EXAM:
DIGITAL SCREENING BILATERAL MAMMOGRAM WITH TOMOSYNTHESIS AND CAD
TECHNIQUE: Bilateral screening digital craniocaudal and mediolateral oblique
mammograms were obtained. Bilateral screening digital breast
tomosynthesis was performed. The images were evaluated with
computer-aided detection.

[L CC synth-2D]
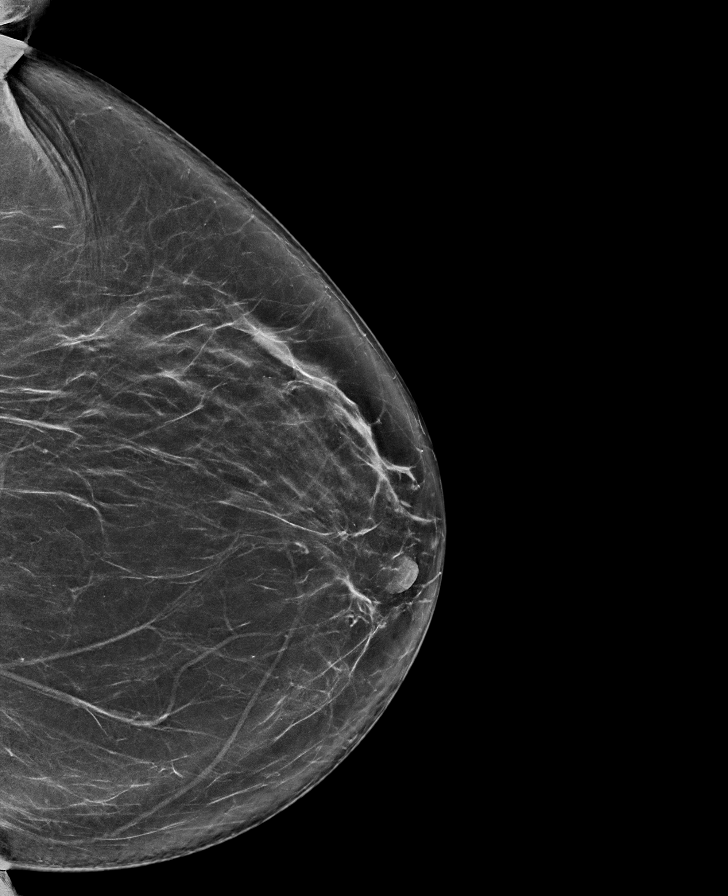

[R CC synth-2D]
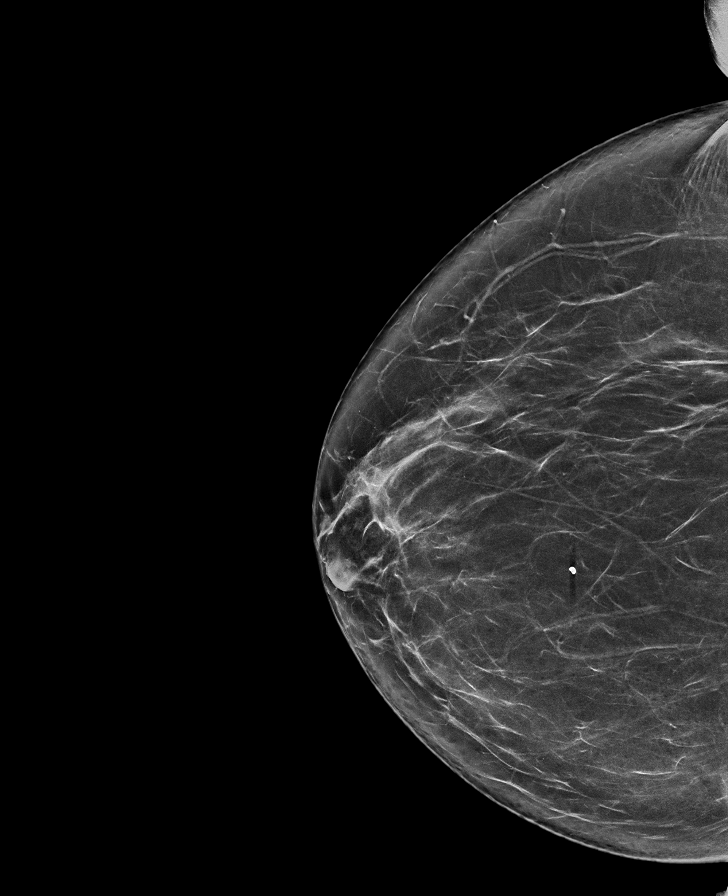

[L MLO synth-2D]
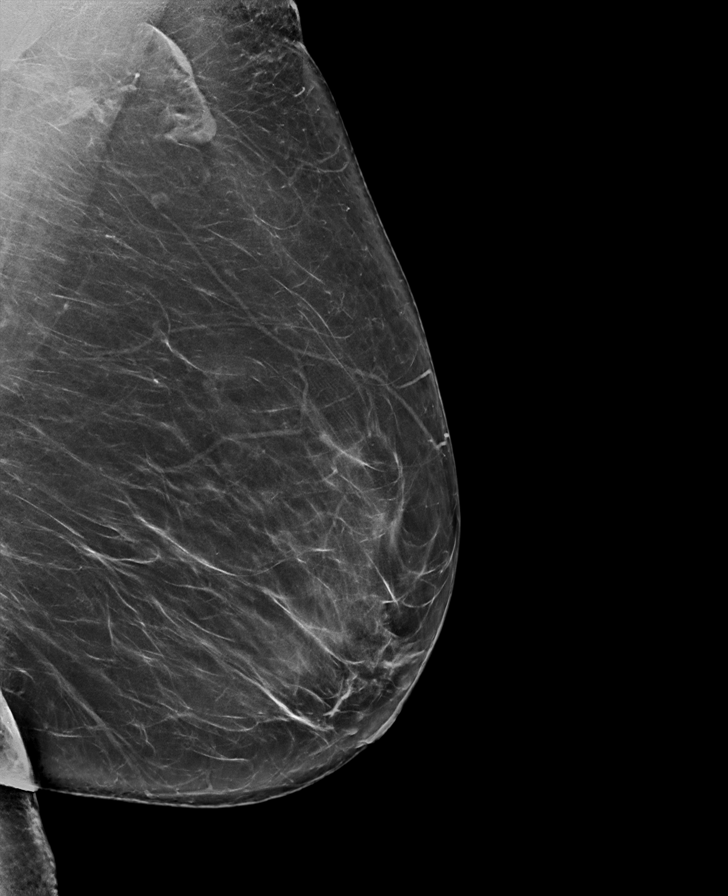

[R MLO synth-2D]
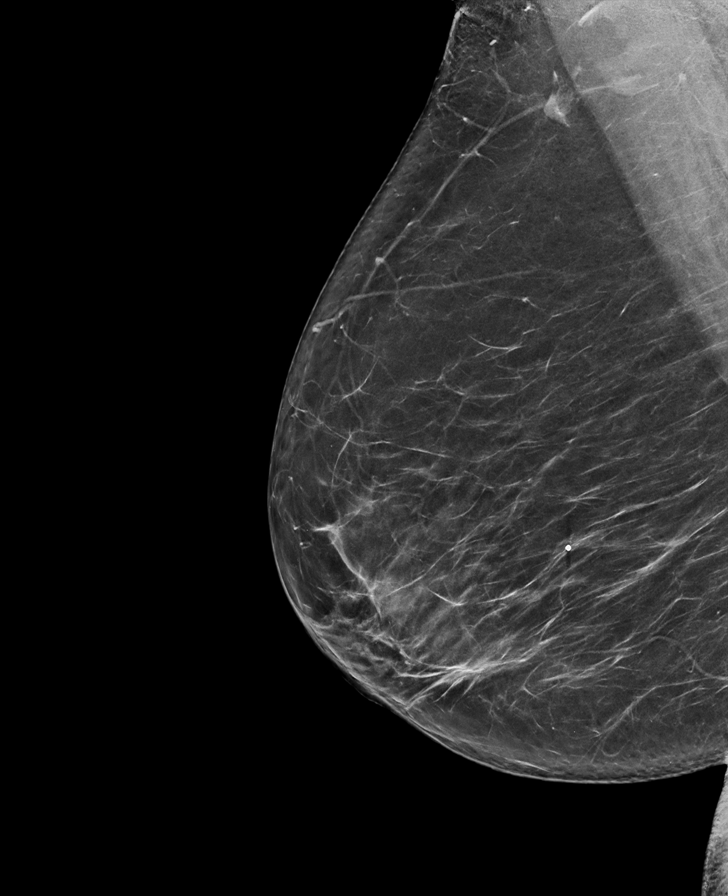

[R MLO tomo · tomo slice 41/82.0]
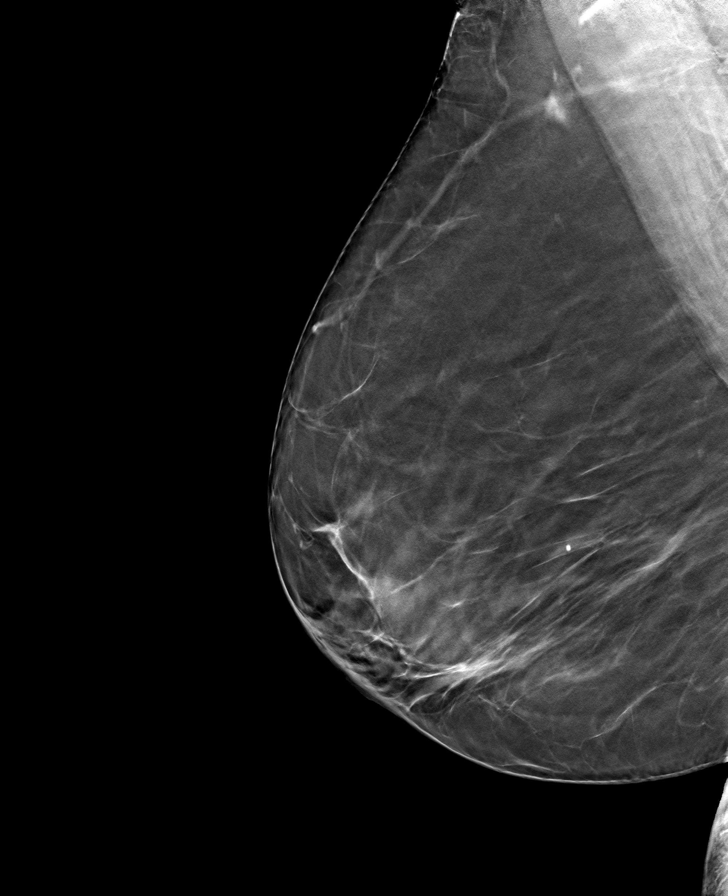

[L MLO tomo · tomo slice 44/87.0]
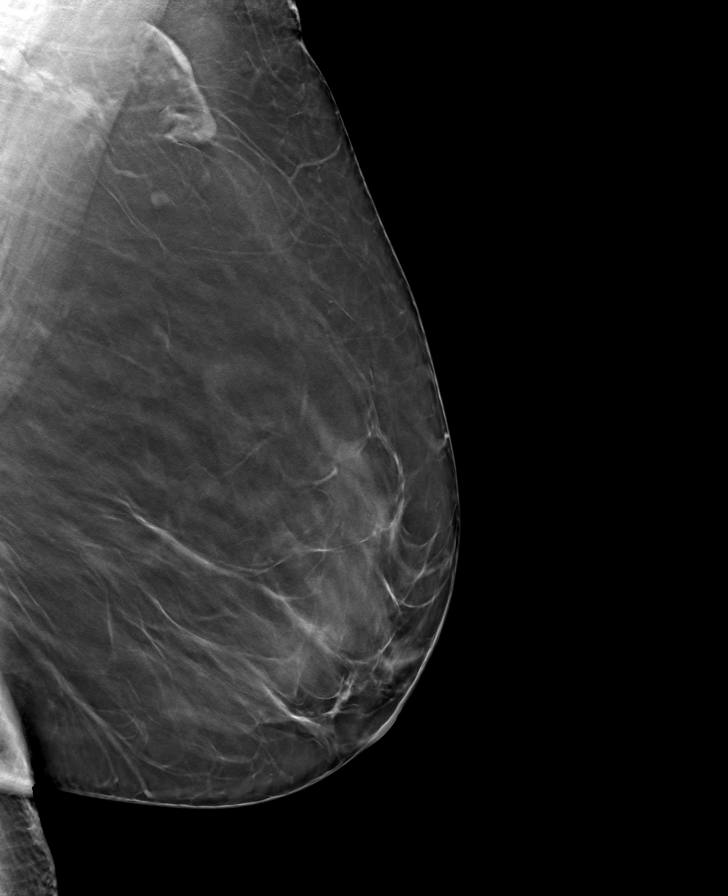

[R CC tomo · tomo slice 40/79.0]
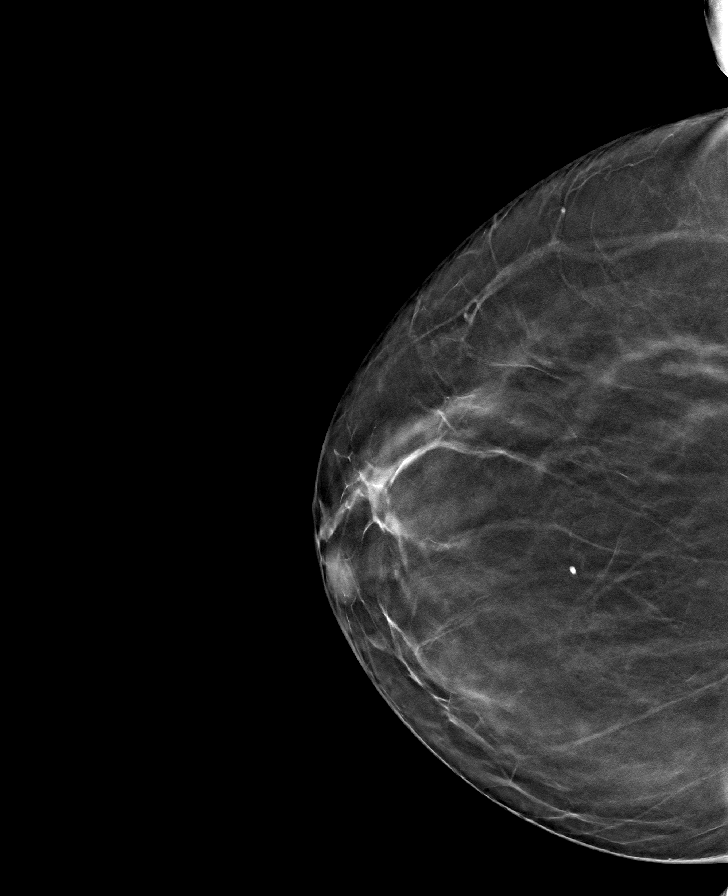

[L CC tomo · tomo slice 41/82.0]
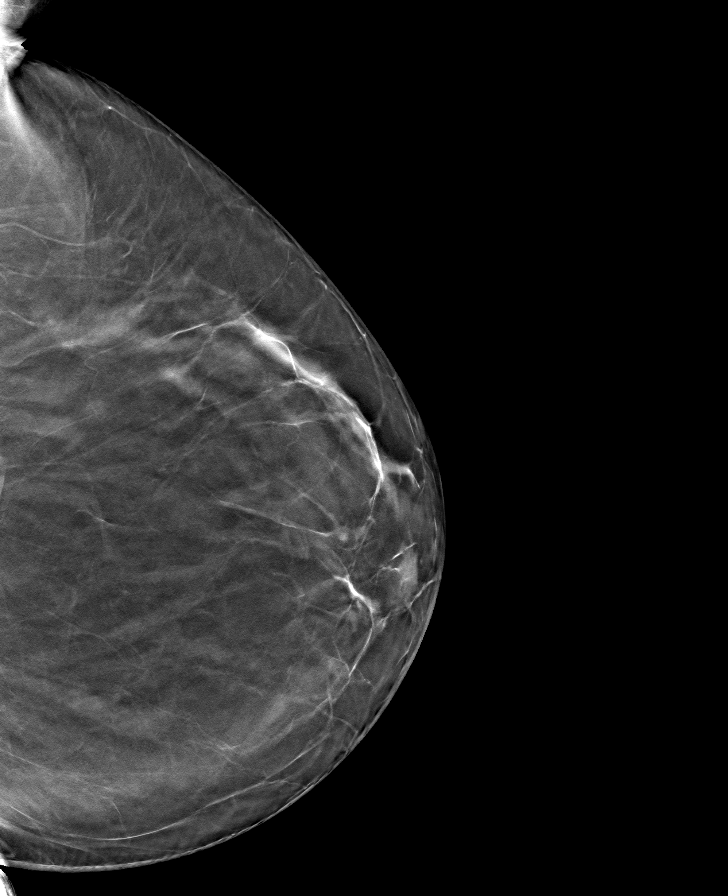

[8 of 24 positions shown; findings below may reference images not displayed]

ACR Breast Density Category b: There are scattered areas of
fibroglandular density.
FINDINGS: There are no findings suspicious for malignancy.
IMPRESSION: No mammographic evidence of malignancy. A result letter of this
screening mammogram will be mailed directly to the patient.

RECOMMENDATION:
Screening mammogram in one year. (Code:51-O-LD2)

BI-RADS CATEGORY  1: Negative.

## 2022-08-19 DIAGNOSIS — K5730 Diverticulosis of large intestine without perforation or abscess without bleeding: Secondary | ICD-10-CM | POA: Diagnosis not present

## 2022-08-19 DIAGNOSIS — K644 Residual hemorrhoidal skin tags: Secondary | ICD-10-CM | POA: Diagnosis not present

## 2022-08-19 DIAGNOSIS — Z1211 Encounter for screening for malignant neoplasm of colon: Secondary | ICD-10-CM | POA: Diagnosis not present

## 2022-08-19 DIAGNOSIS — K648 Other hemorrhoids: Secondary | ICD-10-CM | POA: Diagnosis not present

## 2022-08-19 LAB — HM COLONOSCOPY

## 2022-08-31 DIAGNOSIS — M47816 Spondylosis without myelopathy or radiculopathy, lumbar region: Secondary | ICD-10-CM | POA: Diagnosis not present

## 2022-09-25 ENCOUNTER — Encounter: Payer: Self-pay | Admitting: Physician Assistant

## 2022-09-25 ENCOUNTER — Other Ambulatory Visit: Payer: Medicare HMO

## 2022-09-28 DIAGNOSIS — M47816 Spondylosis without myelopathy or radiculopathy, lumbar region: Secondary | ICD-10-CM | POA: Diagnosis not present

## 2022-09-30 ENCOUNTER — Encounter: Payer: Medicare HMO | Admitting: Physician Assistant

## 2022-10-01 ENCOUNTER — Other Ambulatory Visit: Payer: Medicare HMO

## 2022-10-05 ENCOUNTER — Ambulatory Visit (INDEPENDENT_AMBULATORY_CARE_PROVIDER_SITE_OTHER): Payer: Medicare HMO | Admitting: Physician Assistant

## 2022-10-05 ENCOUNTER — Other Ambulatory Visit: Payer: Self-pay

## 2022-10-05 ENCOUNTER — Encounter: Payer: Self-pay | Admitting: Physician Assistant

## 2022-10-05 ENCOUNTER — Other Ambulatory Visit: Payer: Self-pay | Admitting: Physician Assistant

## 2022-10-05 VITALS — BP 116/70 | HR 89 | Temp 97.1°F | Ht 59.0 in | Wt 181.0 lb

## 2022-10-05 DIAGNOSIS — Z0000 Encounter for general adult medical examination without abnormal findings: Secondary | ICD-10-CM

## 2022-10-05 DIAGNOSIS — I700 Atherosclerosis of aorta: Secondary | ICD-10-CM

## 2022-10-05 DIAGNOSIS — J0100 Acute maxillary sinusitis, unspecified: Secondary | ICD-10-CM

## 2022-10-05 DIAGNOSIS — E782 Mixed hyperlipidemia: Secondary | ICD-10-CM

## 2022-10-05 MED ORDER — CLINDAMYCIN HCL 150 MG PO CAPS
150.0000 mg | ORAL_CAPSULE | Freq: Three times a day (TID) | ORAL | 0 refills | Status: DC
Start: 2022-10-05 — End: 2022-10-26

## 2022-10-05 MED ORDER — PRAVASTATIN SODIUM 20 MG PO TABS: 20.0000 mg | ORAL_TABLET | Freq: Every day | ORAL | 1 refills | Status: DC

## 2022-10-05 NOTE — Progress Notes (Signed)
Subjective:   Caprice Aleasha Felling is a 62 y.o. female who presents for Medicare Annual (Subsequent) preventive examination.  Visit Complete: In person  Patient Medicare AWV questionnaire was completed by the patient on 10/05/2022; I have confirmed that all information answered by patient is correct and no changes since this date.  Review of Systems    CONSTITUTIONAL: Negative for chills, fatigue, fever, unintentional weight gain and unintentional weight loss.  E/N/T: Negative for ear pain, nasal congestion and sore throat.  CARDIOVASCULAR: Negative for chest pain, dizziness, palpitations and pedal edema.  RESPIRATORY: Negative for recent cough and dyspnea.  GASTROINTESTINAL: Negative for abdominal pain, acid reflux symptoms, constipation, diarrhea, nausea and vomiting.  MSK: Negative for arthralgias and myalgias.  INTEGUMENTARY: Negative for rash.  NEUROLOGICAL: Negative for dizziness and headaches.  PSYCHIATRIC: Negative for sleep disturbance and to question depression screen.  Negative for depression, negative for anhedonia.      Cardiac Risk Factors include: advanced age (>36men, >100 women);diabetes mellitus     Objective:    Today's Vitals   10/05/22 1057  BP: 116/70  Pulse: 89  Temp: (!) 97.1 F (36.2 C)  TempSrc: Temporal  SpO2: 97%  Weight: 181 lb (82.1 kg)  Height: 4\' 11"  (1.499 m)  PainSc: 0-No pain   Body mass index is 36.56 kg/m.     10/05/2022   11:07 AM 07/01/2021    2:12 PM  Advanced Directives  Does Patient Have a Medical Advance Directive? No No  Would patient like information on creating a medical advance directive? Yes (ED - Information included in AVS) No - Patient declined    Current Medications (verified) Outpatient Encounter Medications as of 10/05/2022  Medication Sig   diazepam (VALIUM) 2 MG tablet Take 1 tablet (2 mg total) by mouth every 6 (six) hours as needed.   FIBER ADULT GUMMIES PO Take by mouth.   ketorolac (TORADOL) 10 MG tablet  Take 10 mg by mouth 2 (two) times daily as needed.   loratadine (CLARITIN) 10 MG tablet Take 10 mg by mouth daily as needed for allergies.   Naproxen Sodium (ALEVE PO) Take 1 tablet by mouth daily as needed.   nystatin cream (MYCOSTATIN) Apply 1 Application topically 2 (two) times daily.   pravastatin (PRAVACHOL) 20 MG tablet Take 1 tablet (20 mg total) by mouth daily.   Vitamin D, Ergocalciferol, (DRISDOL) 1.25 MG (50000 UNIT) CAPS capsule Take 1 capsule (50,000 Units total) by mouth every 7 (seven) days.   No facility-administered encounter medications on file as of 10/05/2022.    Allergies (verified) Codeine, Shellfish allergy, Ciprofloxacin, Erythromycin, Ibuprofen, Silver nitrate, Sulfa antibiotics, Sulfacetamide, Tramadol, Tramadol hcl, Acetaminophen, Ampicillin, Cefdinir, Cephalexin, Doxycycline, Fish-derived products, Hydrocodone-acetaminophen, and Sulfamethoxazole   History: Past Medical History:  Diagnosis Date   Arthritis    Asthma    Diverticulitis    Emphysema, unspecified (HCC) 2024   Noted on CT imaging 2024   Heartburn    History of COVID-19    Kidney stones    Migraine    Prediabetes    Thoracic aortic atherosclerosis (HCC) 2024   Noted on CT imaging 2024   Tobacco abuse    Past Surgical History:  Procedure Laterality Date   APPENDECTOMY  1985   disc fusion in neck  2022   GALLBLADDER SURGERY  1993   HERNIA REPAIR  1979   ROTATOR CUFF REPAIR  2016   TOTAL ABDOMINAL HYSTERECTOMY  1984   Family History  Problem Relation Age of Onset  Alzheimer's disease Mother    Lupus Father    Lung cancer Sister    Leukemia Sister    Alzheimer's disease Sister    Alzheimer's disease Brother    Breast cancer Neg Hx    Social History   Socioeconomic History   Marital status: Married    Spouse name: Not on file   Number of children: 2   Years of education: Not on file   Highest education level: Not on file  Occupational History   Not on file  Tobacco Use    Smoking status: Every Day    Current packs/day: 0.50    Types: Cigarettes   Smokeless tobacco: Never  Vaping Use   Vaping status: Every Day  Substance and Sexual Activity   Alcohol use: Never   Drug use: Never   Sexual activity: Yes    Partners: Male  Other Topics Concern   Not on file  Social History Narrative   Not on file   Social Determinants of Health   Financial Resource Strain: Low Risk  (10/05/2022)   Overall Financial Resource Strain (CARDIA)    Difficulty of Paying Living Expenses: Not hard at all  Food Insecurity: No Food Insecurity (10/05/2022)   Hunger Vital Sign    Worried About Running Out of Food in the Last Year: Never true    Ran Out of Food in the Last Year: Never true  Transportation Needs: No Transportation Needs (10/05/2022)   PRAPARE - Administrator, Civil Service (Medical): No    Lack of Transportation (Non-Medical): No  Physical Activity: Inactive (10/05/2022)   Exercise Vital Sign    Days of Exercise per Week: 0 days    Minutes of Exercise per Session: 0 min  Stress: No Stress Concern Present (10/05/2022)   [employer] of Occupational Health - Occupational Stress Questionnaire    Feeling of Stress : Not at all  Social Connections: Moderately Integrated (10/05/2022)   Social Connection and Isolation Panel [NHANES]    Frequency of Communication with Friends and Family: More than three times a week    Frequency of Social Gatherings with Friends and Family: Three times a week    Attends Religious Services: More than 4 times per year    Active Member of Clubs or Organizations: No    Attends Banker Meetings: Never    Marital Status: Married    Tobacco Counseling Ready to quit: Not Answered Counseling given: Not Answered   Clinical Intake:     Pain Score: 0-No pain                  Activities of Daily Living    10/05/2022   11:06 AM  In your present state of health, do you have any difficulty  performing the following activities:  Hearing? 0  Vision? 1  Difficulty concentrating or making decisions? 1  Walking or climbing stairs? 0  Dressing or bathing? 0  Doing errands, shopping? 0  Preparing Food and eating ? N  Using the Toilet? N  In the past six months, have you accidently leaked urine? N  Do you have problems with loss of bowel control? N  Managing your Medications? N  Managing your Finances? N  Housekeeping or managing your Housekeeping? N    Patient Care Team: Marianne Sofia, Cordelia Poche as PCP - General (Physician Assistant)  Indicate any recent Medical Services you may have received from other than [facility] providers in the past year (date may be  approximate).    PHYSICAL EXAM:   VS: BP 116/70 (BP Location: Left Arm, Patient Position: Sitting, Cuff Size: Normal)   Pulse 89   Temp (!) 97.1 F (36.2 C) (Temporal)   Ht 4\' 11"  (1.499 m)   Wt 181 lb (82.1 kg)   LMP  (LMP Unknown)   SpO2 97%   BMI 36.56 kg/m   GEN: Well nourished, well developed, in no acute distress  Cardiac: RRR; no murmurs, rubs, or gallops,no edema - no significant varicosities Respiratory:  normal respiratory rate and pattern with no distress - normal breath sounds with no rales, rhonchi, wheezes or rubs  Skin: warm and dry, no rash   Psych: euthymic mood, appropriate affect and demeanor  Assessment:   This is a routine wellness examination for [place].  Hearing/Vision screen Vision Screening   Right eye Left eye Both eyes  Without correction     With correction   20/25    Dietary issues and exercise activities discussed:     Goals Addressed   None   Depression Screen    10/05/2022   11:04 AM 08/03/2022    9:25 AM 06/23/2022   10:48 AM 08/29/2021    9:06 AM 07/01/2021    2:10 PM 05/27/2021    1:58 PM  PHQ 2/9 Scores  PHQ - 2 Score 0 0 0 0 0 0  PHQ- 9 Score  2 3       Fall Risk    10/05/2022   11:06 AM 06/23/2022   10:48 AM 08/29/2021    9:06 AM 07/01/2021    2:12 PM 05/27/2021     1:58 PM  Fall Risk   Falls in the past year? 0 0 0 0 0  Number falls in past yr: 0 0 0 0 0  Injury with Fall? 0 0 0 0 0  Risk for fall due to : No Fall Risks No Fall Risks No Fall Risks No Fall Risks   Follow up Falls evaluation completed Falls evaluation completed Falls evaluation completed Falls prevention discussed     MEDICARE RISK AT HOME: Medicare Risk at Home Any stairs in or around the home?: No If so, are there any without handrails?: No Home free of loose throw rugs in walkways, pet beds, electrical cords, etc?: Yes Adequate lighting in your home to reduce risk of falls?: Yes Life alert?: No Use of a cane, walker or w/c?: No Grab bars in the bathroom?: No Shower chair or bench in shower?: No Elevated toilet seat or a handicapped toilet?: Yes  TIMED UP AND GO:  Was the test performed?  Yes  Length of time to ambulate 10 feet: 15 sec Gait steady and fast with assistive device    Cognitive Function:        10/05/2022   11:11 AM 07/01/2021    2:13 PM  6CIT Screen  What Year? 0 points 0 points  What month? 0 points 0 points  What time? 0 points 0 points  Count back from 20 0 points 0 points  Months in reverse 0 points 0 points  Repeat phrase 0 points 0 points  Total Score 0 points 0 points    Immunizations Immunization History  Administered Date(s) Administered   Tdap 03/27/2022    TDAP status: Up to date  Flu Vaccine status: Due, Education has been provided regarding the importance of this vaccine. Advised may receive this vaccine at local pharmacy or Health Dept. Aware to provide a copy of the vaccination  record if obtained from local pharmacy or Health Dept. Verbalized acceptance and understanding.  Pneumococcal vaccine status: Declined,  Education has been provided regarding the importance of this vaccine but patient still declined. Advised may receive this vaccine at local pharmacy or Health Dept. Aware to provide a copy of the vaccination record if  obtained from local pharmacy or Health Dept. Verbalized acceptance and understanding.   Covid-19 vaccine status: Declined, Education has been provided regarding the importance of this vaccine but patient still declined. Advised may receive this vaccine at local pharmacy or Health Dept.or vaccine clinic. Aware to provide a copy of the vaccination record if obtained from local pharmacy or Health Dept. Verbalized acceptance and understanding.  Qualifies for Shingles Vaccine? Yes   Zostavax completed No   Shingrix Completed?: No.    Education has been provided regarding the importance of this vaccine. Patient has been advised to call insurance company to determine out of pocket expense if they have not yet received this vaccine. Advised may also receive vaccine at local pharmacy or Health Dept. Verbalized acceptance and understanding.  Screening Tests Health Maintenance  Topic Date Due   OPHTHALMOLOGY EXAM  Never done   INFLUENZA VACCINE  09/17/2022   Diabetic kidney evaluation - Urine ACR  12/05/2022   HEMOGLOBIN A1C  01/29/2023   Lung Cancer Screening  04/08/2023   MAMMOGRAM  07/22/2023   Diabetic kidney evaluation - eGFR measurement  07/30/2023   DEXA SCAN  08/01/2023   FOOT EXAM  08/03/2023   Medicare Annual Wellness (AWV)  10/05/2023   DTaP/Tdap/Td (2 - Td or Tdap) 03/27/2032   Colonoscopy  08/18/2032   HPV VACCINES  Aged Out   PAP SMEAR-Modifier  Discontinued   COVID-19 Vaccine  Discontinued   Zoster Vaccines- Shingrix  Discontinued    Health Maintenance  Health Maintenance Due  Topic Date Due   OPHTHALMOLOGY EXAM  Never done   INFLUENZA VACCINE  09/17/2022    Colorectal cancer screening: Type of screening: Colonoscopy. Completed 09/08/2022. Repeat every 10 years  Mammogram status: Completed [id number]. Repeat every year  Bone Density status: Completed 06/152023. Results reflect: Bone density results: OSTEOPENIA. Repeat every 2 years.  Lung Cancer Screening: (Low Dose CT  Chest recommended if Age 30-80 years, 20 pack-year currently smoking OR have quit w/in 15years.) does qualify.   Lung Cancer Screening Referral: CT Done on 04/07/2022  Additional Screening:  Hepatitis C Screening: does qualify; Completed N/A  Vision Screening: Recommended annual ophthalmology exams for early detection of glaucoma and other disorders of the eye. Is the patient up to date with their annual eye exam?  Yes  Who is the provider or what is the name of the office in which the patient attends annual eye exams? [facility] If pt is not established with a provider, would they like to be referred to a provider to establish care? No .   Dental Screening: Recommended annual dental exams for proper oral hygiene  Diabetic Foot Exam: Diabetic Foot Exam: Completed 08/03/2022  Community Resource Referral / Chronic Care Management: CRR required this visit?  No   CCM required this visit?  No     Plan:     I have personally reviewed and noted the following in the patient's chart:   Medical and social history Use of alcohol, tobacco or illicit drugs  Current medications and supplements including opioid prescriptions. Patient is not currently taking opioid prescriptions. Functional ability and status Nutritional status Physical activity Advanced directives List of other physicians Hospitalizations,  surgeries, and ER visits in previous 12 months Vitals Screenings to include cognitive, depression, and falls Referrals and appointments  In addition, I have reviewed and discussed with patient certain preventive protocols, quality metrics, and best practice recommendations. A written personalized care plan for preventive services as well as general preventive health recommendations were provided to patient.     SARA R Trennon Torbeck, PA-C   10/05/2022   After Visit Summary: Printed at Check Out

## 2022-10-06 DIAGNOSIS — M4726 Other spondylosis with radiculopathy, lumbar region: Secondary | ICD-10-CM | POA: Diagnosis not present

## 2022-10-07 ENCOUNTER — Telehealth: Payer: Self-pay | Admitting: Cardiology

## 2022-10-07 NOTE — Telephone Encounter (Signed)
Pt states that she is calling to f/u on Medical Clearance form, the she dropped off at office. Please advise

## 2022-10-08 ENCOUNTER — Telehealth: Payer: Self-pay | Admitting: Cardiology

## 2022-10-08 NOTE — Telephone Encounter (Signed)
    Pre-operative Risk Assessment    Patient Name: Sherry Montgomery  DOB: 1960/12/29 MRN: [id number]        Request for Surgical Clearance     Procedure:   Lumbar 3-4 Laminectomy and fusion with pedicle   Date of Surgery:  Clearance TBD                                 Surgeon:  Malachy Chamber, MD Surgeon's Group or Practice Name:  [facility]  Phone number:  [phone] Fax number:  [phone]   Type of Clearance Requested:   - Medical    Type of Anesthesia:  General    Additional requests/questions:     Golden Pop   10/08/2022, 10:21 AM

## 2022-10-08 NOTE — Telephone Encounter (Signed)
   Name: Sherry Montgomery  DOB: Oct 01, 1960  MRN: [id number]  Primary Cardiologist: None  Chart reviewed as part of pre-operative protocol coverage. The patient has an upcoming visit scheduled with Wallis Bamberg, NP on 10/26/2022 at which time clearance can be addressed in case there are any issues that would impact surgical recommendations.   I added preop FYI to appointment note so that provider is aware to address at time of outpatient visit.  Per office protocol the cardiology provider should forward their finalized clearance decision and recommendations regarding antiplatelet therapy to the requesting party below.     I will route this message as FYI to requesting party and remove this message from the preop box as separate preop APP input not needed at this time.   Please call with any questions.  Napoleon Form, Leodis Rains, NP  10/08/2022, 3:55 PM

## 2022-10-08 NOTE — Telephone Encounter (Signed)
   Pre-operative Risk Assessment    Patient Name: Sherry Montgomery  DOB: [birth year] MRN: [id number]      Request for Surgical Clearance    Procedure:   Not listed   Date of Surgery:  Clearance TBD                                 Surgeon:  Malachy Chamber, MD Surgeon's Group or Practice Name:  [facility]  Phone number:  [phone] Fax number:  [phone]   Type of Clearance Requested:   - Medical    Type of Anesthesia:  General    Additional requests/questions:    Golden Pop   10/08/2022, 10:21 AM

## 2022-10-08 NOTE — Telephone Encounter (Signed)
Contacted the office and got the procedure name.   Lumber 3-4 Laminectomy and Fusion with Pedicle Screws TLIS.   Pre op clearance request will be updated.

## 2022-10-09 ENCOUNTER — Telehealth: Payer: Self-pay

## 2022-10-09 NOTE — Telephone Encounter (Signed)
   [facility] Pre-operative Risk Assessment    Request for surgical clearance:  What type of surgery is being performed? L3-4 Laminecomy and fusion with pedicle screw and TLIF   When is this surgery scheduled? TBD   What type of clearance is required (medical clearance vs. Pharmacy clearance to hold med vs. Both)? Both  Are there any medications that need to be held prior to surgery and how long?Not specified   Practice name and name of physician performing surgery? Dr. Robie Ridge at [facility]   What is your office phone number: [phone]    7.   What is your office fax number: [phone]  8.   Anesthesia type (None, local, MAC, general) ? General    Sherry Montgomery 10/09/2022, 8:35 AM  _________________________________________________________________   (provider comments below)

## 2022-10-09 NOTE — Telephone Encounter (Signed)
Pt was initially upset that she has to wait until sept 9. I did let her know that even with a tele it would not be until the end of the first week of sept. Pt voiced understanding and will wait.

## 2022-10-09 NOTE — Telephone Encounter (Signed)
I called pt and she stated that she dropped her paperwork off at the [facility] office on Tuesday, 08/20. I let he know that as soon as preop team looked over her chart someone would give her a call back to schedule a tele appt or in-office visit. Pt voiced understanding and will call back if any further questions.

## 2022-10-09 NOTE — Telephone Encounter (Signed)
Pt calling back to follow up on her Pre-op clearance.

## 2022-10-14 NOTE — Telephone Encounter (Signed)
Patient's question has been answered. Patient has a telehealth appointment on 10/26/22.

## 2022-10-25 NOTE — Progress Notes (Addendum)
/  Cardiology Office Note:    Date:  10/26/2022   ID:  Sherry Montgomery, DOB [birth year], MRN [id number]  PCP:  Marianne Sofia, Cordelia Poche   [facility] Providers Cardiologist:  Norman Herrlich, MD     Referring MD: Marianne Sofia, PA-C    CC: follow up SOB  History of Present Illness:    Sherry Montgomery is a 62 y.o. female with a hx of thoracic aortic atherosclerosis noted on CT imaging, fibromyalgia, prediabetes, recurrent sinusitis, hyperlipidemia, tobacco abuse, emphysema.  She established care with Dr. Dulce Sellar on 05/01/2022 at the behest of her PCP after recent CT scan of her chest revealed thoracic aorta atherosclerosis.  Most recently evaluated in our office on 07/21/22 with concerns of shortness of breath and fatigue, which had been occurring for a few weeks and was not contributed to any particular activity. She continued to smoke however she has decreased her tobacco intake down drastically, only smoking 5 cigarettes a day, she is vaping with nicotine but voices motivation for complete cessation of both. Repeat echo for shortness of breath revealed an EF of 60-65%, mild MR. We arranged a [name] for OSA evaluation, revealing no OSA.   She presents today for preoperative evaluation for upcoming lower back surgery. She has been doing well, occasionally has some shortness of breath but feels it is improved. She stays active caring for her grandson. She denies chest pain, palpitations, dyspnea, pnd, orthopnea, n, v, dizziness, syncope, edema, weight gain, or early satiety.   Past Medical History:  Diagnosis Date   Arthritis    Asthma    Diverticulitis    Emphysema, unspecified (HCC) 2024   Noted on CT imaging 2024   Heartburn    History of COVID-19    Kidney stones    Migraine    Prediabetes    Thoracic aortic atherosclerosis (HCC) 2024   Noted on CT imaging 2024   Tobacco abuse     Past Surgical History:  Procedure Laterality Date   APPENDECTOMY  1985   disc fusion in neck   2022   GALLBLADDER SURGERY  1993   HERNIA REPAIR  1979   ROTATOR CUFF REPAIR  2016   TOTAL ABDOMINAL HYSTERECTOMY  1984    Current Medications: Current Meds  Medication Sig   diazepam (VALIUM) 2 MG tablet Take 1 tablet (2 mg total) by mouth every 6 (six) hours as needed.   FIBER ADULT GUMMIES PO Take by mouth.   loratadine (CLARITIN) 10 MG tablet Take 10 mg by mouth daily as needed for allergies.   Naproxen Sodium (ALEVE PO) Take 1 tablet by mouth daily as needed.   nystatin cream (MYCOSTATIN) Apply 1 Application topically 2 (two) times daily.   pravastatin (PRAVACHOL) 20 MG tablet Take 1 tablet (20 mg total) by mouth daily.   Vitamin D, Ergocalciferol, (DRISDOL) 1.25 MG (50000 UNIT) CAPS capsule Take 1 capsule (50,000 Units total) by mouth every 7 (seven) days.     Allergies:   Codeine, Shellfish allergy, Ciprofloxacin, Erythromycin, Ibuprofen, Silver nitrate, Sulfa antibiotics, Sulfacetamide, Tramadol, Tramadol hcl, Acetaminophen, Ampicillin, Cefdinir, Cephalexin, Doxycycline, Fish-derived products, Hydrocodone-acetaminophen, and Sulfamethoxazole   Social History   Socioeconomic History   Marital status: Married    Spouse name: Not on file   Number of children: 2   Years of education: Not on file   Highest education level: Not on file  Occupational History   Not on file  Tobacco Use   Smoking status: Every Day  /  Cardiology Office Note:    Date:  10/26/2022   ID:  Sherry Montgomery, DOB [birth year], MRN [id number]  PCP:  Marianne Sofia, Cordelia Poche   [facility] Providers Cardiologist:  Norman Herrlich, MD     Referring MD: Marianne Sofia, PA-C    CC: follow up SOB  History of Present Illness:    Sherry Montgomery is a 62 y.o. female with a hx of thoracic aortic atherosclerosis noted on CT imaging, fibromyalgia, prediabetes, recurrent sinusitis, hyperlipidemia, tobacco abuse, emphysema.  She established care with Dr. Dulce Sellar on 05/01/2022 at the behest of her PCP after recent CT scan of her chest revealed thoracic aorta atherosclerosis.  Most recently evaluated in our office on 07/21/22 with concerns of shortness of breath and fatigue, which had been occurring for a few weeks and was not contributed to any particular activity. She continued to smoke however she has decreased her tobacco intake down drastically, only smoking 5 cigarettes a day, she is vaping with nicotine but voices motivation for complete cessation of both. Repeat echo for shortness of breath revealed an EF of 60-65%, mild MR. We arranged a [name] for OSA evaluation, revealing no OSA.   She presents today for preoperative evaluation for upcoming lower back surgery. She has been doing well, occasionally has some shortness of breath but feels it is improved. She stays active caring for her grandson. She denies chest pain, palpitations, dyspnea, pnd, orthopnea, n, v, dizziness, syncope, edema, weight gain, or early satiety.   Past Medical History:  Diagnosis Date   Arthritis    Asthma    Diverticulitis    Emphysema, unspecified (HCC) 2024   Noted on CT imaging 2024   Heartburn    History of COVID-19    Kidney stones    Migraine    Prediabetes    Thoracic aortic atherosclerosis (HCC) 2024   Noted on CT imaging 2024   Tobacco abuse     Past Surgical History:  Procedure Laterality Date   APPENDECTOMY  1985   disc fusion in neck   2022   GALLBLADDER SURGERY  1993   HERNIA REPAIR  1979   ROTATOR CUFF REPAIR  2016   TOTAL ABDOMINAL HYSTERECTOMY  1984    Current Medications: Current Meds  Medication Sig   diazepam (VALIUM) 2 MG tablet Take 1 tablet (2 mg total) by mouth every 6 (six) hours as needed.   FIBER ADULT GUMMIES PO Take by mouth.   loratadine (CLARITIN) 10 MG tablet Take 10 mg by mouth daily as needed for allergies.   Naproxen Sodium (ALEVE PO) Take 1 tablet by mouth daily as needed.   nystatin cream (MYCOSTATIN) Apply 1 Application topically 2 (two) times daily.   pravastatin (PRAVACHOL) 20 MG tablet Take 1 tablet (20 mg total) by mouth daily.   Vitamin D, Ergocalciferol, (DRISDOL) 1.25 MG (50000 UNIT) CAPS capsule Take 1 capsule (50,000 Units total) by mouth every 7 (seven) days.     Allergies:   Codeine, Shellfish allergy, Ciprofloxacin, Erythromycin, Ibuprofen, Silver nitrate, Sulfa antibiotics, Sulfacetamide, Tramadol, Tramadol hcl, Acetaminophen, Ampicillin, Cefdinir, Cephalexin, Doxycycline, Fish-derived products, Hydrocodone-acetaminophen, and Sulfamethoxazole   Social History   Socioeconomic History   Marital status: Married    Spouse name: Not on file   Number of children: 2   Years of education: Not on file   Highest education level: Not on file  Occupational History   Not on file  Tobacco Use   Smoking status: Every Day  /  Cardiology Office Note:    Date:  10/26/2022   ID:  Sherry Montgomery, DOB [birth year], MRN [id number]  PCP:  Marianne Sofia, Cordelia Poche   [facility] Providers Cardiologist:  Norman Herrlich, MD     Referring MD: Marianne Sofia, PA-C    CC: follow up SOB  History of Present Illness:    Sherry Montgomery is a 62 y.o. female with a hx of thoracic aortic atherosclerosis noted on CT imaging, fibromyalgia, prediabetes, recurrent sinusitis, hyperlipidemia, tobacco abuse, emphysema.  She established care with Dr. Dulce Sellar on 05/01/2022 at the behest of her PCP after recent CT scan of her chest revealed thoracic aorta atherosclerosis.  Most recently evaluated in our office on 07/21/22 with concerns of shortness of breath and fatigue, which had been occurring for a few weeks and was not contributed to any particular activity. She continued to smoke however she has decreased her tobacco intake down drastically, only smoking 5 cigarettes a day, she is vaping with nicotine but voices motivation for complete cessation of both. Repeat echo for shortness of breath revealed an EF of 60-65%, mild MR. We arranged a [name] for OSA evaluation, revealing no OSA.   She presents today for preoperative evaluation for upcoming lower back surgery. She has been doing well, occasionally has some shortness of breath but feels it is improved. She stays active caring for her grandson. She denies chest pain, palpitations, dyspnea, pnd, orthopnea, n, v, dizziness, syncope, edema, weight gain, or early satiety.   Past Medical History:  Diagnosis Date   Arthritis    Asthma    Diverticulitis    Emphysema, unspecified (HCC) 2024   Noted on CT imaging 2024   Heartburn    History of COVID-19    Kidney stones    Migraine    Prediabetes    Thoracic aortic atherosclerosis (HCC) 2024   Noted on CT imaging 2024   Tobacco abuse     Past Surgical History:  Procedure Laterality Date   APPENDECTOMY  1985   disc fusion in neck   2022   GALLBLADDER SURGERY  1993   HERNIA REPAIR  1979   ROTATOR CUFF REPAIR  2016   TOTAL ABDOMINAL HYSTERECTOMY  1984    Current Medications: Current Meds  Medication Sig   diazepam (VALIUM) 2 MG tablet Take 1 tablet (2 mg total) by mouth every 6 (six) hours as needed.   FIBER ADULT GUMMIES PO Take by mouth.   loratadine (CLARITIN) 10 MG tablet Take 10 mg by mouth daily as needed for allergies.   Naproxen Sodium (ALEVE PO) Take 1 tablet by mouth daily as needed.   nystatin cream (MYCOSTATIN) Apply 1 Application topically 2 (two) times daily.   pravastatin (PRAVACHOL) 20 MG tablet Take 1 tablet (20 mg total) by mouth daily.   Vitamin D, Ergocalciferol, (DRISDOL) 1.25 MG (50000 UNIT) CAPS capsule Take 1 capsule (50,000 Units total) by mouth every 7 (seven) days.     Allergies:   Codeine, Shellfish allergy, Ciprofloxacin, Erythromycin, Ibuprofen, Silver nitrate, Sulfa antibiotics, Sulfacetamide, Tramadol, Tramadol hcl, Acetaminophen, Ampicillin, Cefdinir, Cephalexin, Doxycycline, Fish-derived products, Hydrocodone-acetaminophen, and Sulfamethoxazole   Social History   Socioeconomic History   Marital status: Married    Spouse name: Not on file   Number of children: 2   Years of education: Not on file   Highest education level: Not on file  Occupational History   Not on file  Tobacco Use   Smoking status: Every Day  Current packs/day: 0.50    Types: Cigarettes   Smokeless tobacco: Never  Vaping Use   Vaping status: Every Day  Substance and Sexual Activity   Alcohol use: Never   Drug use: Never   Sexual activity: Yes    Partners: Male  Other Topics Concern   Not on file  Social History Narrative   Not on file   Social Determinants of Health   Financial Resource Strain: Low Risk  (10/05/2022)   Overall Financial Resource Strain (CARDIA)    Difficulty of Paying Living Expenses: Not hard at all  Food Insecurity: No Food Insecurity (10/05/2022)   Hunger Vital Sign     Worried About Running Out of Food in the Last Year: Never true    Ran Out of Food in the Last Year: Never true  Transportation Needs: No Transportation Needs (10/05/2022)   PRAPARE - Administrator, Civil Service (Medical): No    Lack of Transportation (Non-Medical): No  Physical Activity: Inactive (10/05/2022)   Exercise Vital Sign    Days of Exercise per Week: 0 days    Minutes of Exercise per Session: 0 min  Stress: No Stress Concern Present (10/05/2022)   [employer] of Occupational Health - Occupational Stress Questionnaire    Feeling of Stress : Not at all  Social Connections: Moderately Integrated (10/05/2022)   Social Connection and Isolation Panel [NHANES]    Frequency of Communication with Friends and Family: More than three times a week    Frequency of Social Gatherings with Friends and Family: Three times a week    Attends Religious Services: More than 4 times per year    Active Member of Clubs or Organizations: No    Attends Banker Meetings: Never    Marital Status: Married     Family History: The patient's family history includes Alzheimer's disease in her brother, mother, and sister; Leukemia in her sister; Lung cancer in her sister; Lupus in her father. There is no history of Breast cancer.  ROS:   Please see the history of present illness.     All other systems reviewed and are negative.  EKGs/Labs/Other Studies Reviewed:    The following studies were reviewed today: Cardiac Studies & Procedures       ECHOCARDIOGRAM  ECHOCARDIOGRAM COMPLETE 07/28/2022  Narrative ECHOCARDIOGRAM REPORT    Patient Name:   Sherry Montgomery Date of Exam: 07/28/2022 Medical Rec #:  [id number]         Height:       59.0 in Accession #:    [id number]        Weight:       181.0 lb Date of Birth:  Sep 02, 1960         BSA:          1.768 m Patient Age:    61 years          BP:           94/70 mmHg Patient Gender: F                 HR:           71  bpm. Exam Location:  [facility]  Procedure: 2D Echo, Cardiac Doppler, Color Doppler and Strain Analysis  Indications:    Shortness of breath [R06.02 (ICD-10-CM)]  History:        Patient has no prior history of Echocardiogram examinations. Signs/Symptoms:Dyspnea; Risk Factors:Dyslipidemia and Current Smoker.  Sonographer:    Gerlene Burdock  Current packs/day: 0.50    Types: Cigarettes   Smokeless tobacco: Never  Vaping Use   Vaping status: Every Day  Substance and Sexual Activity   Alcohol use: Never   Drug use: Never   Sexual activity: Yes    Partners: Male  Other Topics Concern   Not on file  Social History Narrative   Not on file   Social Determinants of Health   Financial Resource Strain: Low Risk  (10/05/2022)   Overall Financial Resource Strain (CARDIA)    Difficulty of Paying Living Expenses: Not hard at all  Food Insecurity: No Food Insecurity (10/05/2022)   Hunger Vital Sign     Worried About Running Out of Food in the Last Year: Never true    Ran Out of Food in the Last Year: Never true  Transportation Needs: No Transportation Needs (10/05/2022)   PRAPARE - Administrator, Civil Service (Medical): No    Lack of Transportation (Non-Medical): No  Physical Activity: Inactive (10/05/2022)   Exercise Vital Sign    Days of Exercise per Week: 0 days    Minutes of Exercise per Session: 0 min  Stress: No Stress Concern Present (10/05/2022)   [employer] of Occupational Health - Occupational Stress Questionnaire    Feeling of Stress : Not at all  Social Connections: Moderately Integrated (10/05/2022)   Social Connection and Isolation Panel [NHANES]    Frequency of Communication with Friends and Family: More than three times a week    Frequency of Social Gatherings with Friends and Family: Three times a week    Attends Religious Services: More than 4 times per year    Active Member of Clubs or Organizations: No    Attends Banker Meetings: Never    Marital Status: Married     Family History: The patient's family history includes Alzheimer's disease in her brother, mother, and sister; Leukemia in her sister; Lung cancer in her sister; Lupus in her father. There is no history of Breast cancer.  ROS:   Please see the history of present illness.     All other systems reviewed and are negative.  EKGs/Labs/Other Studies Reviewed:    The following studies were reviewed today: Cardiac Studies & Procedures       ECHOCARDIOGRAM  ECHOCARDIOGRAM COMPLETE 07/28/2022  Narrative ECHOCARDIOGRAM REPORT    Patient Name:   Sherry Montgomery Date of Exam: 07/28/2022 Medical Rec #:  [id number]         Height:       59.0 in Accession #:    [id number]        Weight:       181.0 lb Date of Birth:  Sep 02, 1960         BSA:          1.768 m Patient Age:    61 years          BP:           94/70 mmHg Patient Gender: F                 HR:           71  bpm. Exam Location:  [facility]  Procedure: 2D Echo, Cardiac Doppler, Color Doppler and Strain Analysis  Indications:    Shortness of breath [R06.02 (ICD-10-CM)]  History:        Patient has no prior history of Echocardiogram examinations. Signs/Symptoms:Dyspnea; Risk Factors:Dyslipidemia and Current Smoker.  Sonographer:    Gerlene Burdock  Current packs/day: 0.50    Types: Cigarettes   Smokeless tobacco: Never  Vaping Use   Vaping status: Every Day  Substance and Sexual Activity   Alcohol use: Never   Drug use: Never   Sexual activity: Yes    Partners: Male  Other Topics Concern   Not on file  Social History Narrative   Not on file   Social Determinants of Health   Financial Resource Strain: Low Risk  (10/05/2022)   Overall Financial Resource Strain (CARDIA)    Difficulty of Paying Living Expenses: Not hard at all  Food Insecurity: No Food Insecurity (10/05/2022)   Hunger Vital Sign     Worried About Running Out of Food in the Last Year: Never true    Ran Out of Food in the Last Year: Never true  Transportation Needs: No Transportation Needs (10/05/2022)   PRAPARE - Administrator, Civil Service (Medical): No    Lack of Transportation (Non-Medical): No  Physical Activity: Inactive (10/05/2022)   Exercise Vital Sign    Days of Exercise per Week: 0 days    Minutes of Exercise per Session: 0 min  Stress: No Stress Concern Present (10/05/2022)   [employer] of Occupational Health - Occupational Stress Questionnaire    Feeling of Stress : Not at all  Social Connections: Moderately Integrated (10/05/2022)   Social Connection and Isolation Panel [NHANES]    Frequency of Communication with Friends and Family: More than three times a week    Frequency of Social Gatherings with Friends and Family: Three times a week    Attends Religious Services: More than 4 times per year    Active Member of Clubs or Organizations: No    Attends Banker Meetings: Never    Marital Status: Married     Family History: The patient's family history includes Alzheimer's disease in her brother, mother, and sister; Leukemia in her sister; Lung cancer in her sister; Lupus in her father. There is no history of Breast cancer.  ROS:   Please see the history of present illness.     All other systems reviewed and are negative.  EKGs/Labs/Other Studies Reviewed:    The following studies were reviewed today: Cardiac Studies & Procedures       ECHOCARDIOGRAM  ECHOCARDIOGRAM COMPLETE 07/28/2022  Narrative ECHOCARDIOGRAM REPORT    Patient Name:   Sherry Montgomery Date of Exam: 07/28/2022 Medical Rec #:  [id number]         Height:       59.0 in Accession #:    [id number]        Weight:       181.0 lb Date of Birth:  Sep 02, 1960         BSA:          1.768 m Patient Age:    61 years          BP:           94/70 mmHg Patient Gender: F                 HR:           71  bpm. Exam Location:  [facility]  Procedure: 2D Echo, Cardiac Doppler, Color Doppler and Strain Analysis  Indications:    Shortness of breath [R06.02 (ICD-10-CM)]  History:        Patient has no prior history of Echocardiogram examinations. Signs/Symptoms:Dyspnea; Risk Factors:Dyslipidemia and Current Smoker.  Sonographer:    Gerlene Burdock

## 2022-10-26 ENCOUNTER — Encounter: Payer: Self-pay | Admitting: Cardiology

## 2022-10-26 ENCOUNTER — Ambulatory Visit: Payer: Medicare HMO | Attending: Cardiology | Admitting: Cardiology

## 2022-10-26 VITALS — BP 100/70 | HR 75 | Ht 59.0 in | Wt 185.0 lb

## 2022-10-26 DIAGNOSIS — E782 Mixed hyperlipidemia: Secondary | ICD-10-CM

## 2022-10-26 DIAGNOSIS — Z720 Tobacco use: Secondary | ICD-10-CM

## 2022-10-26 DIAGNOSIS — R0683 Snoring: Secondary | ICD-10-CM | POA: Diagnosis not present

## 2022-10-26 DIAGNOSIS — I700 Atherosclerosis of aorta: Secondary | ICD-10-CM | POA: Diagnosis not present

## 2022-10-26 DIAGNOSIS — R0602 Shortness of breath: Secondary | ICD-10-CM | POA: Diagnosis not present

## 2022-10-26 DIAGNOSIS — Z01810 Encounter for preprocedural cardiovascular examination: Secondary | ICD-10-CM

## 2022-10-26 NOTE — Patient Instructions (Addendum)
Medication Instructions:  Your physician recommends that you continue on your current medications as directed. Please refer to the Current Medication list given to you today.  *If you need a refill on your cardiac medications before your next appointment, please call your pharmacy*   Lab Work: NONE If you have labs (blood work) drawn today and your tests are completely normal, you will receive your results only by: MyChart Message (if you have MyChart) OR A paper copy in the mail If you have any lab test that is abnormal or we need to change your treatment, we will call you to review the results.   Testing/Procedures: Your physician has requested that you have a lexiscan myoview. For further information please visit [email or web address] Please follow instruction sheet, as given.   The test will take approximately 3 to 4 hours to complete; you may bring reading material.  If someone comes with you to your appointment, they will need to remain in the main lobby due to limited space in the testing area. How to prepare for your Myocardial Perfusion Test:            Do not eat or drink 3 hours prior to your test, except you may have water. Do not consume products containing caffeine (regular or decaffeinated) 12 hours prior to your test. (ex: coffee, chocolate, sodas, tea). Do bring a list of your current medications with you.  If not listed below, you may take your medications as normal. Do wear comfortable clothes (no dresses or overalls) and walking shoes, tennis shoes preferred (No heels or open toe shoes are allowed). Do NOT wear cologne, perfume, aftershave, or lotions (deodorant is allowed). If these instructions are not followed, your test will have to be rescheduled.    Follow-Up: At [facility], you and your health needs are our priority.  As part of our continuing mission to provide you with exceptional heart care, we have created designated Provider Care Teams.  These  Care Teams include your primary Cardiologist (physician) and Advanced Practice Providers (APPs -  Physician Assistants and Nurse Practitioners) who all work together to provide you with the care you need, when you need it.  We recommend signing up for the patient portal called "MyChart".  Sign up information is provided on this After Visit Summary.  MyChart is used to connect with patients for Virtual Visits (Telemedicine).  Patients are able to view lab/test results, encounter notes, upcoming appointments, etc.  Non-urgent messages can be sent to your provider as well.   To learn more about what you can do with MyChart, go to [email or web address].    Your next appointment:   6 month(s)  Provider:   Norman Herrlich, MD    Other Instructions

## 2022-10-27 ENCOUNTER — Telehealth: Payer: Self-pay

## 2022-10-27 NOTE — Telephone Encounter (Signed)
Spoke with the patient, detailed instructions given. She stated that she would be here for her test. Asked to call back with any questions.  Sherry Montgomery CCT

## 2022-10-29 ENCOUNTER — Ambulatory Visit: Payer: Medicare HMO | Attending: Cardiology

## 2022-10-29 DIAGNOSIS — I2510 Atherosclerotic heart disease of native coronary artery without angina pectoris: Secondary | ICD-10-CM | POA: Diagnosis not present

## 2022-10-29 DIAGNOSIS — Z01810 Encounter for preprocedural cardiovascular examination: Secondary | ICD-10-CM

## 2022-10-29 LAB — MYOCARDIAL PERFUSION IMAGING
LV dias vol: 54 mL (ref 46–106)
LV sys vol: 16 mL
Nuc Stress EF: 70 %
Peak HR: 102 {beats}/min
Rest HR: 69 {beats}/min
Rest Nuclear Isotope Dose: 10 mCi
SDS: 4
SRS: 6
SSS: 9
ST Depression (mm): 0 mm
Stress Nuclear Isotope Dose: 30 mCi
TID: 1.19

## 2022-10-29 MED ORDER — TECHNETIUM TC 99M TETROFOSMIN IV KIT
10.0000 | PACK | Freq: Once | INTRAVENOUS | Status: AC | PRN
  Administered 2022-10-29: 10 via INTRAVENOUS

## 2022-10-29 MED ORDER — TECHNETIUM TC 99M TETROFOSMIN IV KIT
30.0000 | PACK | Freq: Once | INTRAVENOUS | Status: AC | PRN
  Administered 2022-10-29: 30 via INTRAVENOUS

## 2022-10-29 MED ORDER — REGADENOSON 0.4 MG/5ML IV SOLN
0.4000 mg | Freq: Once | INTRAVENOUS | Status: AC
  Administered 2022-10-29: 0.4 mg via INTRAVENOUS

## 2022-10-30 ENCOUNTER — Telehealth: Payer: Self-pay

## 2022-10-30 ENCOUNTER — Telehealth: Payer: Self-pay | Admitting: *Deleted

## 2022-10-30 NOTE — Telephone Encounter (Signed)
Office was calling for update

## 2022-10-30 NOTE — Telephone Encounter (Signed)
Will send message to pre op to review if pt has been cleared. Pt looks to have recently been seen by Wallis Bamberg, 10/26/22.

## 2022-10-30 NOTE — Telephone Encounter (Signed)
Pt viewed results on MY Chart Per [facility] note. Routed to PCP.

## 2022-11-10 ENCOUNTER — Telehealth: Payer: Self-pay | Admitting: Cardiology

## 2022-11-10 DIAGNOSIS — M48062 Spinal stenosis, lumbar region with neurogenic claudication: Secondary | ICD-10-CM | POA: Insufficient documentation

## 2022-11-10 NOTE — Telephone Encounter (Signed)
Pt calling to state that the office of her surgery has not received the paperwork and wanted to talk with the Pre-Op nurse. Please advise

## 2022-11-10 NOTE — Telephone Encounter (Signed)
Spoke with patient who informed me that we had the wong fax number and the office hasn't received the clearance notes. The patient gave me the correct fax number [phone] and I have faxed it to Dr. Robie Ridge at [facility], ATTN: Susanne Borders

## 2022-11-24 DIAGNOSIS — M5432 Sciatica, left side: Secondary | ICD-10-CM | POA: Diagnosis not present

## 2022-11-24 DIAGNOSIS — M4726 Other spondylosis with radiculopathy, lumbar region: Secondary | ICD-10-CM | POA: Diagnosis not present

## 2022-11-24 DIAGNOSIS — M5431 Sciatica, right side: Secondary | ICD-10-CM | POA: Diagnosis not present

## 2022-12-21 ENCOUNTER — Telehealth: Payer: Self-pay

## 2022-12-21 NOTE — Telephone Encounter (Signed)
Patient left voicemail requesting a call back. Attempted to reach patient, left message to call our office back.

## 2022-12-23 NOTE — Progress Notes (Deleted)
Acute Office Visit  Subjective:    Patient ID: Sherry Montgomery, female    DOB: 08-03-1960, 62 y.o.   MRN: [id number]  Chief Complaint  Patient presents with   Herpes Zoster    HPI: Patient is in today for ***  Past Medical History:  Diagnosis Date   Arthritis    Asthma    Diverticulitis    Emphysema, unspecified (HCC) 2024   Noted on CT imaging 2024   Heartburn    History of COVID-19    Kidney stones    Migraine    Prediabetes    Thoracic aortic atherosclerosis (HCC) 2024   Noted on CT imaging 2024   Tobacco abuse     Past Surgical History:  Procedure Laterality Date   APPENDECTOMY  1985   disc fusion in neck  2022   GALLBLADDER SURGERY  1993   HERNIA REPAIR  1979   ROTATOR CUFF REPAIR  2016   TOTAL ABDOMINAL HYSTERECTOMY  1984    Family History  Problem Relation Age of Onset   Alzheimer's disease Mother    Lupus Father    Lung cancer Sister    Leukemia Sister    Alzheimer's disease Sister    Alzheimer's disease Brother    Breast cancer Neg Hx     Social History   Socioeconomic History   Marital status: Married    Spouse name: Not on file   Number of children: 2   Years of education: Not on file   Highest education level: Not on file  Occupational History   Not on file  Tobacco Use   Smoking status: Every Day    Current packs/day: 0.50    Types: Cigarettes   Smokeless tobacco: Never  Vaping Use   Vaping status: Every Day  Substance and Sexual Activity   Alcohol use: Never   Drug use: Never   Sexual activity: Yes    Partners: Male  Other Topics Concern   Not on file  Social History Narrative   Not on file   Social Determinants of Health   Financial Resource Strain: Low Risk  (10/05/2022)   Overall Financial Resource Strain (CARDIA)    Difficulty of Paying Living Expenses: Not hard at all  Food Insecurity: No Food Insecurity (10/05/2022)   Hunger Vital Sign    Worried About Running Out of Food in the Last Year: Never true    Ran  Out of Food in the Last Year: Never true  Transportation Needs: No Transportation Needs (10/05/2022)   PRAPARE - Administrator, Civil Service (Medical): No    Lack of Transportation (Non-Medical): No  Physical Activity: Inactive (10/05/2022)   Exercise Vital Sign    Days of Exercise per Week: 0 days    Minutes of Exercise per Session: 0 min  Stress: No Stress Concern Present (10/05/2022)   [employer] of Occupational Health - Occupational Stress Questionnaire    Feeling of Stress : Not at all  Social Connections: Moderately Integrated (10/05/2022)   Social Connection and Isolation Panel [NHANES]    Frequency of Communication with Friends and Family: More than three times a week    Frequency of Social Gatherings with Friends and Family: Three times a week    Attends Religious Services: More than 4 times per year    Active Member of Clubs or Organizations: No    Attends Banker Meetings: Never    Marital Status: Married  Catering manager Violence: Not At  Risk (10/05/2022)   Humiliation, Afraid, Rape, and Kick questionnaire    Fear of Current or Ex-Partner: No    Emotionally Abused: No    Physically Abused: No    Sexually Abused: No    Outpatient Medications Prior to Visit  Medication Sig Dispense Refill   diazepam (VALIUM) 2 MG tablet Take 1 tablet (2 mg total) by mouth every 6 (six) hours as needed. 30 tablet 0   FIBER ADULT GUMMIES PO Take by mouth.     loratadine (CLARITIN) 10 MG tablet Take 10 mg by mouth daily as needed for allergies.     Naproxen Sodium (ALEVE PO) Take 1 tablet by mouth daily as needed.     nystatin cream (MYCOSTATIN) Apply 1 Application topically 2 (two) times daily. 30 g 2   pravastatin (PRAVACHOL) 20 MG tablet Take 1 tablet (20 mg total) by mouth daily. 90 tablet 1   Vitamin D, Ergocalciferol, (DRISDOL) 1.25 MG (50000 UNIT) CAPS capsule Take 1 capsule (50,000 Units total) by mouth every 7 (seven) days. 5 capsule 5   No  facility-administered medications prior to visit.    Allergies  Allergen Reactions   Codeine Anaphylaxis    Throat swelling Throat swelling Throat swelling    Shellfish Allergy Anaphylaxis    Other reaction(s): Shock (ALLERGY) Other reaction(s): Shock (ALLERGY)    Ciprofloxacin Nausea And Vomiting   Erythromycin    Ibuprofen Other (See Comments)    Other reaction(s): Sweating (intolerance) Other reaction(s): Sweating (intolerance)    Silver Nitrate Swelling   Sulfa Antibiotics    Sulfacetamide    Tramadol Other (See Comments)    ask ask ask    Tramadol Hcl    Acetaminophen Rash   Ampicillin Rash   Cefdinir Rash   Cephalexin Rash   Doxycycline Rash   Fish-Derived Products Rash   Hydrocodone-Acetaminophen Nausea And Vomiting and Rash    ask ask ask    Sulfamethoxazole Rash    Review of Systems     Objective:        10/29/2022   10:56 AM 10/26/2022    7:55 AM 10/05/2022   10:57 AM  Vitals with BMI  Height 4\' 11"  4\' 11"  4\' 11"   Weight 185 lbs 185 lbs 181 lbs  BMI 37.35 37.35 36.54  Systolic  100 116  Diastolic  70 70  Pulse  75 89    No data found.   Physical Exam  Health Maintenance Due  Topic Date Due   OPHTHALMOLOGY EXAM  Never done   INFLUENZA VACCINE  09/17/2022   Diabetic kidney evaluation - Urine ACR  12/05/2022    There are no preventive care reminders to display for this patient.   Lab Results  Component Value Date   TSH 1.740 03/23/2022   Lab Results  Component Value Date   WBC 8.5 07/30/2022   HGB 15.0 07/30/2022   HCT 45.9 07/30/2022   MCV 95 07/30/2022   PLT 276 07/30/2022   Lab Results  Component Value Date   NA 140 07/30/2022   K 4.8 07/30/2022   CO2 22 07/30/2022   GLUCOSE 104 (H) 07/30/2022   BUN 16 07/30/2022   CREATININE 1.03 (H) 07/30/2022   BILITOT 0.2 07/30/2022   ALKPHOS 73 07/30/2022   AST 38 07/30/2022   ALT 45 (H) 07/30/2022   PROT 7.1 07/30/2022   ALBUMIN 4.0 07/30/2022   CALCIUM 9.3 07/30/2022    EGFR 62 07/30/2022   Lab Results  Component Value Date  CHOL 154 07/30/2022   Lab Results  Component Value Date   HDL 39 (L) 07/30/2022   Lab Results  Component Value Date   LDLCALC 85 07/30/2022   Lab Results  Component Value Date   TRIG 174 (H) 07/30/2022   Lab Results  Component Value Date   CHOLHDL 3.9 07/30/2022   Lab Results  Component Value Date   HGBA1C 6.1 (H) 07/30/2022       Assessment & Plan:  There are no diagnoses linked to this encounter.   No orders of the defined types were placed in this encounter.   No orders of the defined types were placed in this encounter.    Follow-up: No follow-ups on file.  An After Visit Summary was printed and given to the patient.  Renne Crigler, FNP [facility] [phone]

## 2022-12-24 ENCOUNTER — Encounter: Payer: Medicare HMO | Admitting: Family Medicine

## 2022-12-25 ENCOUNTER — Encounter: Payer: Self-pay | Admitting: Family Medicine

## 2022-12-25 ENCOUNTER — Other Ambulatory Visit: Payer: Medicare HMO

## 2022-12-25 DIAGNOSIS — M47816 Spondylosis without myelopathy or radiculopathy, lumbar region: Secondary | ICD-10-CM | POA: Diagnosis not present

## 2022-12-25 DIAGNOSIS — M4726 Other spondylosis with radiculopathy, lumbar region: Secondary | ICD-10-CM

## 2022-12-25 NOTE — Progress Notes (Signed)
Error

## 2022-12-25 NOTE — Progress Notes (Signed)
[facility] Fax [phone]. Dr Malachy Chamber.

## 2022-12-25 NOTE — Progress Notes (Deleted)
This encounter was created in error - please disregard.

## 2022-12-31 DIAGNOSIS — M5432 Sciatica, left side: Secondary | ICD-10-CM | POA: Diagnosis not present

## 2022-12-31 DIAGNOSIS — M4726 Other spondylosis with radiculopathy, lumbar region: Secondary | ICD-10-CM | POA: Diagnosis not present

## 2022-12-31 DIAGNOSIS — M5431 Sciatica, right side: Secondary | ICD-10-CM | POA: Diagnosis not present

## 2023-01-02 LAB — NICOTINE/COTININE METABOLITES
Cotinine: <1 ng/mL
Nicotine: <1 ng/mL

## 2023-01-21 ENCOUNTER — Ambulatory Visit: Payer: Medicare HMO | Admitting: Physician Assistant

## 2023-01-22 ENCOUNTER — Ambulatory Visit (INDEPENDENT_AMBULATORY_CARE_PROVIDER_SITE_OTHER): Payer: Medicare HMO | Admitting: Physician Assistant

## 2023-01-22 ENCOUNTER — Encounter: Payer: Self-pay | Admitting: Physician Assistant

## 2023-01-22 VITALS — BP 118/76 | HR 66 | Temp 97.6°F | Ht 59.0 in | Wt 196.0 lb

## 2023-01-22 DIAGNOSIS — L309 Dermatitis, unspecified: Secondary | ICD-10-CM | POA: Diagnosis not present

## 2023-01-22 MED ORDER — TRIAMCINOLONE ACETONIDE 0.1 % EX CREA
1.0000 | TOPICAL_CREAM | Freq: Two times a day (BID) | CUTANEOUS | 1 refills | Status: DC
Start: 2023-01-22 — End: 2023-06-02

## 2023-01-22 NOTE — Progress Notes (Signed)
Acute Office Visit  Subjective:    Patient ID: Sherry Montgomery, female    DOB: 01-31-1961, 62 y.o.   MRN: [id number]  Chief Complaint  Patient presents with   Rash    HPI: Patient is in today for a rash that has come and come over the past few weeks - states she has had spots on chest, face, arms and legs.  Today has red rash on upper chest that is inflamed, itch and burning She has tried otc cream and takes claritin Pt cannot tolerate prednisone so that is not option to treat   Current Outpatient Medications:    triamcinolone cream (KENALOG) 0.1 %, Apply 1 Application topically 2 (two) times daily., Disp: 30 g, Rfl: 1   diazepam (VALIUM) 2 MG tablet, Take 1 tablet (2 mg total) by mouth every 6 (six) hours as needed., Disp: 30 tablet, Rfl: 0   FIBER ADULT GUMMIES PO, Take by mouth., Disp: , Rfl:    loratadine (CLARITIN) 10 MG tablet, Take 10 mg by mouth daily as needed for allergies., Disp: , Rfl:    Naproxen Sodium (ALEVE PO), Take 1 tablet by mouth daily as needed., Disp: , Rfl:    nystatin cream (MYCOSTATIN), Apply 1 Application topically 2 (two) times daily., Disp: 30 g, Rfl: 2   pravastatin (PRAVACHOL) 20 MG tablet, Take 1 tablet (20 mg total) by mouth daily., Disp: 90 tablet, Rfl: 1   Vitamin D, Ergocalciferol, (DRISDOL) 1.25 MG (50000 UNIT) CAPS capsule, Take 1 capsule (50,000 Units total) by mouth every 7 (seven) days., Disp: 5 capsule, Rfl: 5  Allergies  Allergen Reactions   Codeine Anaphylaxis    Throat swelling Throat swelling Throat swelling    Shellfish Allergy Anaphylaxis    Other reaction(s): Shock (ALLERGY) Other reaction(s): Shock (ALLERGY)    Ciprofloxacin Nausea And Vomiting   Erythromycin    Ibuprofen Other (See Comments)    Other reaction(s): Sweating (intolerance) Other reaction(s): Sweating (intolerance)    Prednisone    Silver Nitrate Swelling   Sulfa Antibiotics    Sulfacetamide    Tramadol Other (See Comments)    ask ask ask     Tramadol Hcl    Acetaminophen Rash   Ampicillin Rash   Cefdinir Rash   Cephalexin Rash   Doxycycline Rash   Fish-Derived Products Rash   Hydrocodone-Acetaminophen Nausea And Vomiting and Rash    ask ask ask    Sulfamethoxazole Rash    ROS CONSTITUTIONAL: Negative for chills, fatigue, fever,  E/N/T: Negative for ear pain, nasal congestion and sore throat.  CARDIOVASCULAR: Negative for chest pain, dizziness,  RESPIRATORY: Negative for recent cough and dyspnea.  INTEGUMENTARY: see HPI      Objective:    PHYSICAL EXAM:   BP 118/76 (BP Location: Left Arm, Patient Position: Sitting)   Pulse 66   Temp 97.6 F (36.4 C) (Temporal)   Ht 4\' 11"  (1.499 m)   Wt 196 lb (88.9 kg)   LMP  (LMP Unknown)   SpO2 98%   BMI 39.59 kg/m    GEN: Well nourished, well developed, in no acute distress  Cardiac: RRR; no murmurs,  Respiratory:  normal respiratory rate and pattern with no distress - normal breath sounds with no rales, rhonchi, wheezes or rubs Skin: red, inflamed rash on chest      Assessment & Plan:    Dermatitis -     Triamcinolone Acetonide; Apply 1 Application topically 2 (two) times daily.  Dispense: 30 g; Refill: 1  Samples given of zyrtec to take qd (in place of claritin    Follow-up: Return if symptoms worsen or fail to improve.  An After Visit Summary was printed and given to the patient.  Jettie Pagan [facility] [phone]

## 2023-01-31 ENCOUNTER — Other Ambulatory Visit: Payer: Self-pay

## 2023-01-31 DIAGNOSIS — E1165 Type 2 diabetes mellitus with hyperglycemia: Secondary | ICD-10-CM

## 2023-01-31 DIAGNOSIS — E559 Vitamin D deficiency, unspecified: Secondary | ICD-10-CM

## 2023-01-31 DIAGNOSIS — E782 Mixed hyperlipidemia: Secondary | ICD-10-CM

## 2023-02-01 ENCOUNTER — Other Ambulatory Visit: Payer: Medicare HMO

## 2023-02-01 DIAGNOSIS — E782 Mixed hyperlipidemia: Secondary | ICD-10-CM

## 2023-02-01 DIAGNOSIS — E559 Vitamin D deficiency, unspecified: Secondary | ICD-10-CM | POA: Diagnosis not present

## 2023-02-01 DIAGNOSIS — E1165 Type 2 diabetes mellitus with hyperglycemia: Secondary | ICD-10-CM

## 2023-02-02 LAB — CBC WITH DIFFERENTIAL/PLATELET
Basophils Absolute: 0.1 10*3/uL (ref 0.0–0.2)
Basos: 1 %
EOS (ABSOLUTE): 0.3 10*3/uL (ref 0.0–0.4)
Eos: 4 %
Hematocrit: 45.6 % (ref 34.0–46.6)
Hemoglobin: 15 g/dL (ref 11.1–15.9)
Immature Grans (Abs): 0 10*3/uL (ref 0.0–0.1)
Immature Granulocytes: 0 %
Lymphocytes Absolute: 2.9 10*3/uL (ref 0.7–3.1)
Lymphs: 38 %
MCH: 31.2 pg (ref 26.6–33.0)
MCHC: 32.9 g/dL (ref 31.5–35.7)
MCV: 95 fL (ref 79–97)
Monocytes Absolute: 0.9 10*3/uL (ref 0.1–0.9)
Monocytes: 11 %
Neutrophils Absolute: 3.5 10*3/uL (ref 1.4–7.0)
Neutrophils: 46 %
Platelets: 290 10*3/uL (ref 150–450)
RBC: 4.81 x10E6/uL (ref 3.77–5.28)
RDW: 11.3 % — ABNORMAL LOW (ref 11.7–15.4)
WBC: 7.8 10*3/uL (ref 3.4–10.8)

## 2023-02-02 LAB — COMPREHENSIVE METABOLIC PANEL
ALT: 19 [IU]/L (ref 0–32)
AST: 23 [IU]/L (ref 0–40)
Albumin: 3.9 g/dL (ref 3.9–4.9)
Alkaline Phosphatase: 67 [IU]/L (ref 44–121)
BUN/Creatinine Ratio: 15 (ref 12–28)
BUN: 14 mg/dL (ref 8–27)
Bilirubin Total: 0.3 mg/dL (ref 0.0–1.2)
CO2: 21 mmol/L (ref 20–29)
Calcium: 8.8 mg/dL (ref 8.7–10.3)
Chloride: 103 mmol/L (ref 96–106)
Creatinine, Ser: 0.91 mg/dL (ref 0.57–1.00)
Globulin, Total: 3.4 g/dL (ref 1.5–4.5)
Glucose: 110 mg/dL — ABNORMAL HIGH (ref 70–99)
Potassium: 4.6 mmol/L (ref 3.5–5.2)
Sodium: 141 mmol/L (ref 134–144)
Total Protein: 7.3 g/dL (ref 6.0–8.5)
eGFR: 72 mL/min/{1.73_m2} (ref >59)

## 2023-02-02 LAB — LIPID PANEL
Chol/HDL Ratio: 4.5 {ratio} — ABNORMAL HIGH (ref 0.0–4.4)
Cholesterol, Total: 167 mg/dL (ref 100–199)
HDL: 37 mg/dL — ABNORMAL LOW (ref >39)
LDL Chol Calc (NIH): 101 mg/dL — ABNORMAL HIGH (ref 0–99)
Triglycerides: 162 mg/dL — ABNORMAL HIGH (ref 0–149)
VLDL Cholesterol Cal: 29 mg/dL (ref 5–40)

## 2023-02-02 LAB — HEMOGLOBIN A1C
Est. average glucose Bld gHb Est-mCnc: 134 mg/dL
Hgb A1c MFr Bld: 6.3 % — ABNORMAL HIGH (ref 4.8–5.6)

## 2023-02-02 LAB — VITAMIN D 25 HYDROXY (VIT D DEFICIENCY, FRACTURES): Vit D, 25-Hydroxy: 19.3 ng/mL — ABNORMAL LOW (ref 30.0–100.0)

## 2023-02-03 ENCOUNTER — Encounter: Payer: Self-pay | Admitting: Physician Assistant

## 2023-02-03 ENCOUNTER — Ambulatory Visit (INDEPENDENT_AMBULATORY_CARE_PROVIDER_SITE_OTHER): Payer: Medicare HMO | Admitting: Physician Assistant

## 2023-02-03 VITALS — BP 102/60 | HR 63 | Temp 97.3°F | Resp 14 | Ht 59.0 in | Wt 197.0 lb

## 2023-02-03 DIAGNOSIS — E785 Hyperlipidemia, unspecified: Secondary | ICD-10-CM

## 2023-02-03 DIAGNOSIS — E1169 Type 2 diabetes mellitus with other specified complication: Secondary | ICD-10-CM | POA: Diagnosis not present

## 2023-02-03 DIAGNOSIS — F419 Anxiety disorder, unspecified: Secondary | ICD-10-CM | POA: Diagnosis not present

## 2023-02-03 DIAGNOSIS — E559 Vitamin D deficiency, unspecified: Secondary | ICD-10-CM

## 2023-02-03 DIAGNOSIS — E1165 Type 2 diabetes mellitus with hyperglycemia: Secondary | ICD-10-CM | POA: Diagnosis not present

## 2023-02-03 MED ORDER — PRAVASTATIN SODIUM 40 MG PO TABS: 40.0000 mg | ORAL_TABLET | Freq: Every day | ORAL | 3 refills | Status: AC

## 2023-02-03 NOTE — Progress Notes (Signed)
Established Patient Office Visit  Subjective:  Patient ID: Sherry Montgomery, female    DOB: 23-Aug-1960  Age: 62 y.o. MRN: [id number]  CC:  Chief Complaint  Patient presents with   Medical Management of Chronic Issues    HPI Sherry Montgomery presents for chronic follow up  Pt with history of diabetes - had hgb A1c of 6.3 recently  She has currently managed with diet - is trying to decrease carbs/sugars Voices no concerns or problems  Pt with diagnosis of vit D def - recently had labwork that showed Vit d low - has not been taking med as directed  Pt with history of anxiety - rarely uses valium as needed - voices no problems  Mixed hyperlipidemia  Pt presents with hyperlipidemia. Compliance with treatment has been good The patient is compliant with medications, maintains a low cholesterol diet , follows up as directed , and maintains an exercise regimen . The patient denies experiencing any hypercholesterolemia related symptoms. Currently taking pravachol 20mg    Past Medical History:  Diagnosis Date   Arthritis    Asthma    Diverticulitis    Emphysema, unspecified (HCC) 2024   Noted on CT imaging 2024   Heartburn    History of COVID-19    Kidney stones    Migraine    Prediabetes    Thoracic aortic atherosclerosis (HCC) 2024   Noted on CT imaging 2024   Tobacco abuse     Past Surgical History:  Procedure Laterality Date   APPENDECTOMY  1985   disc fusion in neck  2022   GALLBLADDER SURGERY  1993   HERNIA REPAIR  1979   ROTATOR CUFF REPAIR  2016   TOTAL ABDOMINAL HYSTERECTOMY  1984    Family History  Problem Relation Age of Onset   Alzheimer's disease Mother    Lupus Father    Lung cancer Sister    Leukemia Sister    Alzheimer's disease Sister    Alzheimer's disease Brother    Breast cancer Neg Hx     Social History   Socioeconomic History   Marital status: Married    Spouse name: Not on file   Number of children: 2   Years of education: Not on  file   Highest education level: Not on file  Occupational History   Not on file  Tobacco Use   Smoking status: Former    Average packs/day: 1 pack/day for 50.7 years (50.7 ttl pk-yrs)    Types: Cigarettes    Start date: 1974   Smokeless tobacco: Never  Vaping Use   Vaping status: Every Day  Substance and Sexual Activity   Alcohol use: Never   Drug use: Never   Sexual activity: Yes    Partners: Male  Other Topics Concern   Not on file  Social History Narrative   Not on file   Social Drivers of Health   Financial Resource Strain: Low Risk  (10/05/2022)   Overall Financial Resource Strain (CARDIA)    Difficulty of Paying Living Expenses: Not hard at all  Food Insecurity: No Food Insecurity (10/05/2022)   Hunger Vital Sign    Worried About Running Out of Food in the Last Year: Never true    Ran Out of Food in the Last Year: Never true  Transportation Needs: No Transportation Needs (10/05/2022)   PRAPARE - Administrator, Civil Service (Medical): No    Lack of Transportation (Non-Medical): No  Physical Activity: Inactive (10/05/2022)  Exercise Vital Sign    Days of Exercise per Week: 0 days    Minutes of Exercise per Session: 0 min  Stress: No Stress Concern Present (10/05/2022)   [employer] of Occupational Health - Occupational Stress Questionnaire    Feeling of Stress : Not at all  Social Connections: Moderately Integrated (10/05/2022)   Social Connection and Isolation Panel [NHANES]    Frequency of Communication with Friends and Family: More than three times a week    Frequency of Social Gatherings with Friends and Family: Three times a week    Attends Religious Services: More than 4 times per year    Active Member of Clubs or Organizations: No    Attends Banker Meetings: Never    Marital Status: Married  Catering manager Violence: Not At Risk (10/05/2022)   Humiliation, Afraid, Rape, and Kick questionnaire    Fear of Current or Ex-Partner:  No    Emotionally Abused: No    Physically Abused: No    Sexually Abused: No     Current Outpatient Medications:    cetirizine (ZYRTEC) 10 MG tablet, Take 10 mg by mouth daily., Disp: , Rfl:    diazepam (VALIUM) 2 MG tablet, Take 1 tablet (2 mg total) by mouth every 6 (six) hours as needed., Disp: 30 tablet, Rfl: 0   FIBER ADULT GUMMIES PO, Take by mouth., Disp: , Rfl:    Naproxen Sodium (ALEVE PO), Take 1 tablet by mouth daily as needed., Disp: , Rfl:    nystatin cream (MYCOSTATIN), Apply 1 Application topically 2 (two) times daily., Disp: 30 g, Rfl: 2   pravastatin (PRAVACHOL) 40 MG tablet, Take 1 tablet (40 mg total) by mouth daily., Disp: 90 tablet, Rfl: 3   triamcinolone cream (KENALOG) 0.1 %, Apply 1 Application topically 2 (two) times daily., Disp: 30 g, Rfl: 1   Vitamin D, Ergocalciferol, (DRISDOL) 1.25 MG (50000 UNIT) CAPS capsule, Take 1 capsule (50,000 Units total) by mouth every 7 (seven) days., Disp: 5 capsule, Rfl: 5   Allergies  Allergen Reactions   Codeine Anaphylaxis    Throat swelling Throat swelling Throat swelling    Shellfish Allergy Anaphylaxis    Other reaction(s): Shock (ALLERGY) Other reaction(s): Shock (ALLERGY)    Ciprofloxacin Nausea And Vomiting   Erythromycin    Ibuprofen Other (See Comments)    Other reaction(s): Sweating (intolerance) Other reaction(s): Sweating (intolerance)    Prednisone    Silver Nitrate Swelling   Sulfa Antibiotics    Sulfacetamide    Tramadol Other (See Comments)    ask ask ask    Tramadol Hcl    Acetaminophen Rash   Ampicillin Rash   Cefdinir Rash   Cephalexin Rash   Doxycycline Rash   Fish-Derived Products Rash   Hydrocodone-Acetaminophen Nausea And Vomiting and Rash    ask ask ask    Sulfamethoxazole Rash    CONSTITUTIONAL: Negative for chills, fatigue, fever, unintentional weight gain and unintentional weight loss.  E/N/T: Negative for ear pain, nasal congestion and sore throat.  CARDIOVASCULAR:  Negative for chest pain, dizziness, palpitations and pedal edema.  RESPIRATORY: Negative for recent cough and dyspnea.  GASTROINTESTINAL: Negative for abdominal pain, acid reflux symptoms, constipation, diarrhea, nausea and vomiting.  MSK: Negative for arthralgias and myalgias.  INTEGUMENTARY: Negative for rash.  NEUROLOGICAL: Negative for dizziness and headaches.  PSYCHIATRIC: Negative for sleep disturbance and to question depression screen.  Negative for depression, negative for anhedonia.         Objective:  PHYSICAL EXAM:   VS: BP 102/60   Pulse 63   Temp (!) 97.3 F (36.3 C)   Resp 14   Ht 4\' 11"  (1.499 m)   Wt 197 lb (89.4 kg)   LMP  (LMP Unknown)   SpO2 98%   BMI 39.79 kg/m   GEN: Well nourished, well developed, in no acute distress  Cardiac: RRR; no murmurs, rubs, or gallops,no edema - Respiratory:  normal respiratory rate and pattern with no distress - normal breath sounds with no rales, rhonchi, wheezes or rubs MS: no deformity or atrophy  Skin: warm and dry, no rash  Neuro:  Alert and Oriented x 3, - CN II-Xii grossly intact Psych: euthymic mood, appropriate affect and demeanor    There are no preventive care reminders to display for this patient.   There are no preventive care reminders to display for this patient.  Lab Results  Component Value Date   TSH 1.740 03/23/2022   Lab Results  Component Value Date   WBC 7.8 02/01/2023   HGB 15.0 02/01/2023   HCT 45.6 02/01/2023   MCV 95 02/01/2023   PLT 290 02/01/2023   Lab Results  Component Value Date   NA 141 02/01/2023   K 4.6 02/01/2023   CO2 21 02/01/2023   GLUCOSE 110 (H) 02/01/2023   BUN 14 02/01/2023   CREATININE 0.91 02/01/2023   BILITOT 0.3 02/01/2023   ALKPHOS 67 02/01/2023   AST 23 02/01/2023   ALT 19 02/01/2023   PROT 7.3 02/01/2023   ALBUMIN 3.9 02/01/2023   CALCIUM 8.8 02/01/2023   EGFR 72 02/01/2023   Lab Results  Component Value Date   CHOL 167 02/01/2023   Lab  Results  Component Value Date   HDL 37 (L) 02/01/2023   Lab Results  Component Value Date   LDLCALC 101 (H) 02/01/2023   Lab Results  Component Value Date   TRIG 162 (H) 02/01/2023   Lab Results  Component Value Date   CHOLHDL 4.5 (H) 02/01/2023   Lab Results  Component Value Date   HGBA1C 6.3 (H) 02/01/2023      Assessment & Plan:   Problem List Items Addressed This Visit   None Visit Diagnoses     Type 2 diabetes mellitus with hyperglycemia, without long-term current use of insulin (HCC)    -  Primary Continue to watch diet   Hyperlipidemia associated with diabetes (HCC) Watch diet Increase pravachol to 40mg  qd   Vitamin D insufficiency     Take vit D weekly as directed  Anxiety Use valium prn                                           Meds ordered this encounter  Medications   pravastatin (PRAVACHOL) 40 MG tablet    Sig: Take 1 tablet (40 mg total) by mouth daily.    Dispense:  90 tablet    Refill:  3    Supervising Provider:   Blane Ohara [id number]    Follow-up: Return in about 6 months (around 08/04/2023) for chronic fasting follow-up.    SARA R Conley Delisle, PA-C

## 2023-03-05 DIAGNOSIS — M4726 Other spondylosis with radiculopathy, lumbar region: Secondary | ICD-10-CM | POA: Diagnosis not present

## 2023-03-05 DIAGNOSIS — M5431 Sciatica, right side: Secondary | ICD-10-CM | POA: Diagnosis not present

## 2023-03-05 DIAGNOSIS — M5432 Sciatica, left side: Secondary | ICD-10-CM | POA: Diagnosis not present

## 2023-03-08 DIAGNOSIS — H25813 Combined forms of age-related cataract, bilateral: Secondary | ICD-10-CM | POA: Diagnosis not present

## 2023-03-11 DIAGNOSIS — Z01810 Encounter for preprocedural cardiovascular examination: Secondary | ICD-10-CM | POA: Diagnosis not present

## 2023-04-09 ENCOUNTER — Encounter: Payer: Self-pay | Admitting: Family Medicine

## 2023-04-09 ENCOUNTER — Ambulatory Visit (INDEPENDENT_AMBULATORY_CARE_PROVIDER_SITE_OTHER): Payer: Medicare PPO | Admitting: Family Medicine

## 2023-04-09 VITALS — BP 122/80 | HR 77 | Temp 97.8°F | Ht 59.0 in | Wt 202.0 lb

## 2023-04-09 DIAGNOSIS — R062 Wheezing: Secondary | ICD-10-CM | POA: Diagnosis not present

## 2023-04-09 DIAGNOSIS — H6693 Otitis media, unspecified, bilateral: Secondary | ICD-10-CM | POA: Insufficient documentation

## 2023-04-09 DIAGNOSIS — J0100 Acute maxillary sinusitis, unspecified: Secondary | ICD-10-CM | POA: Diagnosis not present

## 2023-04-09 MED ORDER — CLINDAMYCIN HCL 150 MG PO CAPS
150.0000 mg | ORAL_CAPSULE | Freq: Three times a day (TID) | ORAL | 0 refills | Status: AC
Start: 2023-04-09 — End: 2023-04-16

## 2023-04-09 MED ORDER — ALBUTEROL SULFATE HFA 108 (90 BASE) MCG/ACT IN AERS: 2.0000 | INHALATION_SPRAY | Freq: Four times a day (QID) | RESPIRATORY_TRACT | 2 refills | Status: DC | PRN

## 2023-04-09 NOTE — Assessment & Plan Note (Signed)
 Reports of ear pain. Bilateral erythema noted on examination. History of tympanostomy tubes with recurrent fluid accumulation post-extrusion. -Clindamycin should also cover potential middle ear infection

## 2023-04-09 NOTE — Assessment & Plan Note (Signed)
 History of asthma, currently asymptomatic. Wheezing noted on examination. -Order Albuterol inhaler for use as needed.

## 2023-04-09 NOTE — Assessment & Plan Note (Signed)
 Reports of nasal congestion, bloody nasal discharge, and facial pain. History of green nasal discharge followed by bloody discharge. No fever reported. -Start Clindamycin 150mg  three times a day for 7 days. -Consider use of Mucinex to help loosen nasal congestion.

## 2023-04-09 NOTE — Progress Notes (Signed)
 Acute Office Visit  Subjective:    Patient ID: Sherry Montgomery, female    DOB: April 16, 1960, 63 y.o.   MRN: [id number]  Chief Complaint  Patient presents with   Sinusitis         Discussed the use of AI scribe software for clinical note transcription with the patient, who gave verbal consent to proceed.   HPI Sherry Montgomery is a 63 year old female who presents with sinus congestion and ear pain. Patient is in today for sinus symptoms which started 3 weeks ago c/o facial pain and pressure, headache, congestion (when blowing nose has blood), taste is off.  She has been experiencing sinus congestion and ear pain since yesterday afternoon. The nasal discharge was initially green and hard, later turning into bloody scabs. Significant congestion and an earache began yesterday. She visited the dentist due to dental pain, and there was concern about a recently filled tooth. She has a history of ear infections following the removal of ear tubes, which were in place for two years.  She recalls a potential exposure to COVID-19 two weeks ago at church, where she was near a person who later reported loss of taste and smell, though she did not have direct contact. No fever is present, but she feels unusually cold, which is atypical for her. She also reports headaches across her forehead and fatigue.  She has a history of asthma but has not used an inhaler in a long time. She mentions a previous experience with COVID-19, during which she developed a painful oral lesion diagnosed as a 'COVID sore'.   Past Medical History:  Diagnosis Date   Arthritis    Asthma    Diverticulitis    Emphysema, unspecified (HCC) 2024   Noted on CT imaging 2024   Heartburn    History of COVID-19    Kidney stones    Migraine    Prediabetes    Thoracic aortic atherosclerosis (HCC) 2024   Noted on CT imaging 2024   Tobacco abuse     Past Surgical History:  Procedure Laterality Date   APPENDECTOMY  1985    disc fusion in neck  2022   GALLBLADDER SURGERY  1993   HERNIA REPAIR  1979   ROTATOR CUFF REPAIR  2016   TOTAL ABDOMINAL HYSTERECTOMY  1984    Family History  Problem Relation Age of Onset   Alzheimer's disease Mother    Lupus Father    Lung cancer Sister    Leukemia Sister    Alzheimer's disease Sister    Alzheimer's disease Brother    Breast cancer Neg Hx     Social History   Socioeconomic History   Marital status: Married    Spouse name: Not on file   Number of children: 2   Years of education: Not on file   Highest education level: Not on file  Occupational History   Not on file  Tobacco Use   Smoking status: Former    Average packs/day: 1 pack/day for 50.7 years (50.7 ttl pk-yrs)    Types: Cigarettes    Start date: 1974   Smokeless tobacco: Never  Vaping Use   Vaping status: Every Day  Substance and Sexual Activity   Alcohol use: Never   Drug use: Never   Sexual activity: Yes    Partners: Male  Other Topics Concern   Not on file  Social History Narrative   Not on file   Social Drivers of Health  Financial Resource Strain: Low Risk  (10/05/2022)   Overall Financial Resource Strain (CARDIA)    Difficulty of Paying Living Expenses: Not hard at all  Food Insecurity: No Food Insecurity (10/05/2022)   Hunger Vital Sign    Worried About Running Out of Food in the Last Year: Never true    Ran Out of Food in the Last Year: Never true  Transportation Needs: No Transportation Needs (10/05/2022)   PRAPARE - Administrator, Civil Service (Medical): No    Lack of Transportation (Non-Medical): No  Physical Activity: Inactive (10/05/2022)   Exercise Vital Sign    Days of Exercise per Week: 0 days    Minutes of Exercise per Session: 0 min  Stress: No Stress Concern Present (10/05/2022)   [employer] of Occupational Health - Occupational Stress Questionnaire    Feeling of Stress : Not at all  Social Connections: Moderately Integrated (10/05/2022)    Social Connection and Isolation Panel [NHANES]    Frequency of Communication with Friends and Family: More than three times a week    Frequency of Social Gatherings with Friends and Family: Three times a week    Attends Religious Services: More than 4 times per year    Active Member of Clubs or Organizations: No    Attends Banker Meetings: Never    Marital Status: Married  Catering manager Violence: Not At Risk (10/05/2022)   Humiliation, Afraid, Rape, and Kick questionnaire    Fear of Current or Ex-Partner: No    Emotionally Abused: No    Physically Abused: No    Sexually Abused: No    Outpatient Medications Prior to Visit  Medication Sig Dispense Refill   cetirizine (ZYRTEC) 10 MG tablet Take 10 mg by mouth daily.     diazepam (VALIUM) 2 MG tablet Take 1 tablet (2 mg total) by mouth every 6 (six) hours as needed. 30 tablet 0   FIBER ADULT GUMMIES PO Take by mouth.     Naproxen Sodium (ALEVE PO) Take 1 tablet by mouth daily as needed.     nystatin cream (MYCOSTATIN) Apply 1 Application topically 2 (two) times daily. 30 g 2   pravastatin (PRAVACHOL) 40 MG tablet Take 1 tablet (40 mg total) by mouth daily. 90 tablet 3   triamcinolone cream (KENALOG) 0.1 % Apply 1 Application topically 2 (two) times daily. 30 g 1   Vitamin D, Ergocalciferol, (DRISDOL) 1.25 MG (50000 UNIT) CAPS capsule Take 1 capsule (50,000 Units total) by mouth every 7 (seven) days. 5 capsule 5   No facility-administered medications prior to visit.    Allergies  Allergen Reactions   Codeine Anaphylaxis    Throat swelling Throat swelling Throat swelling    Shellfish Allergy Anaphylaxis    Other reaction(s): Shock (ALLERGY) Other reaction(s): Shock (ALLERGY)    Ciprofloxacin Nausea And Vomiting   Erythromycin    Ibuprofen Other (See Comments)    Other reaction(s): Sweating (intolerance) Other reaction(s): Sweating (intolerance)    Prednisone    Silver Nitrate Swelling   Sulfa Antibiotics     Sulfacetamide    Tramadol Other (See Comments)    ask ask ask    Tramadol Hcl    Acetaminophen Rash   Ampicillin Rash   Cefdinir Rash   Cephalexin Rash   Doxycycline Rash   Fish-Derived Products Rash   Hydrocodone-Acetaminophen Nausea And Vomiting and Rash    ask ask ask    Sulfamethoxazole Rash    Review of Systems  Constitutional:  Positive for chills and fatigue. Negative for fever.  HENT:  Positive for congestion, ear pain, sinus pressure, sinus pain (maxillary) and voice change. Negative for postnasal drip, rhinorrhea and sore throat.   Respiratory:  Negative for cough and shortness of breath.   Cardiovascular:  Negative for chest pain.  Gastrointestinal:  Negative for diarrhea, nausea and vomiting.  Genitourinary:  Negative for dysuria and frequency.  Neurological:  Positive for headaches (frontal). Negative for dizziness.       Objective:        04/09/2023    7:45 AM 02/03/2023    8:08 AM 01/22/2023    9:04 AM  Vitals with BMI  Height 4\' 11"  4\' 11"  4\' 11"   Weight 202 lbs 197 lbs 196 lbs  BMI 40.78 39.77 39.57  Systolic 122 102 161  Diastolic 80 60 76  Pulse 77 63 66    No data found.   Physical Exam Constitutional:      General: She is not in acute distress.    Appearance: Normal appearance. She is ill-appearing.  HENT:     Right Ear: Tenderness present. A middle ear effusion is present. Tympanic membrane is erythematous.     Left Ear: A middle ear effusion is present. Tympanic membrane is erythematous.     Nose:     Right Sinus: Maxillary sinus tenderness and frontal sinus tenderness present.     Left Sinus: Maxillary sinus tenderness and frontal sinus tenderness present.     Mouth/Throat:     Pharynx: Posterior oropharyngeal erythema present.     Tonsils: 1+ on the right. 1+ on the left.  Eyes:     Conjunctiva/sclera: Conjunctivae normal.  Cardiovascular:     Rate and Rhythm: Normal rate and regular rhythm.     Heart sounds: Normal heart  sounds. No murmur heard. Pulmonary:     Effort: Pulmonary effort is normal.     Breath sounds: Examination of the left-upper field reveals wheezing. Wheezing present.  Abdominal:     General: Bowel sounds are normal.     Palpations: Abdomen is soft.     Tenderness: There is no abdominal tenderness.  Musculoskeletal:        General: Normal range of motion.     Cervical back: Normal range of motion.  Skin:    General: Skin is warm.  Neurological:     Mental Status: She is alert. Mental status is at baseline.  Psychiatric:        Mood and Affect: Mood normal.        Behavior: Behavior normal.     Health Maintenance Due  Topic Date Due   Pneumococcal Vaccine 68-25 Years old (1 of 2 - PCV) Never done   OPHTHALMOLOGY EXAM  Never done   Diabetic kidney evaluation - Urine ACR  12/05/2022   Lung Cancer Screening  04/08/2023    There are no preventive care reminders to display for this patient.   Lab Results  Component Value Date   TSH 1.740 03/23/2022   Lab Results  Component Value Date   WBC 7.8 02/01/2023   HGB 15.0 02/01/2023   HCT 45.6 02/01/2023   MCV 95 02/01/2023   PLT 290 02/01/2023   Lab Results  Component Value Date   NA 141 02/01/2023   K 4.6 02/01/2023   CO2 21 02/01/2023   GLUCOSE 110 (H) 02/01/2023   BUN 14 02/01/2023   CREATININE 0.91 02/01/2023   BILITOT 0.3 02/01/2023   ALKPHOS 67 02/01/2023  AST 23 02/01/2023   ALT 19 02/01/2023   PROT 7.3 02/01/2023   ALBUMIN 3.9 02/01/2023   CALCIUM 8.8 02/01/2023   EGFR 72 02/01/2023   Lab Results  Component Value Date   CHOL 167 02/01/2023   Lab Results  Component Value Date   HDL 37 (L) 02/01/2023   Lab Results  Component Value Date   LDLCALC 101 (H) 02/01/2023   Lab Results  Component Value Date   TRIG 162 (H) 02/01/2023   Lab Results  Component Value Date   CHOLHDL 4.5 (H) 02/01/2023   Lab Results  Component Value Date   HGBA1C 6.3 (H) 02/01/2023       Assessment & Plan:  Acute  non-recurrent maxillary sinusitis Assessment & Plan: Reports of nasal congestion, bloody nasal discharge, and facial pain. History of green nasal discharge followed by bloody discharge. No fever reported. -Start Clindamycin 150mg  three times a day for 7 days. -Consider use of Mucinex to help loosen nasal congestion.  Orders: -     Clindamycin HCl; Take 1 capsule (150 mg total) by mouth 3 (three) times daily for 7 days.  Dispense: 21 capsule; Refill: 0  Acute otitis media, bilateral Assessment & Plan: Reports of ear pain. Bilateral erythema noted on examination. History of tympanostomy tubes with recurrent fluid accumulation post-extrusion. -Clindamycin should also cover potential middle ear infection   Wheezing on expiration Assessment & Plan: History of asthma, currently asymptomatic. Wheezing noted on examination. -Order Albuterol inhaler for use as needed.  Orders: -     Albuterol Sulfate HFA; Inhale 2 puffs into the lungs every 6 (six) hours as needed for wheezing or shortness of breath.  Dispense: 17 each; Refill: 2     Meds ordered this encounter  Medications   clindamycin (CLEOCIN) 150 MG capsule    Sig: Take 1 capsule (150 mg total) by mouth 3 (three) times daily for 7 days.    Dispense:  21 capsule    Refill:  0   albuterol (VENTOLIN HFA) 108 (90 Base) MCG/ACT inhaler    Sig: Inhale 2 puffs into the lungs every 6 (six) hours as needed for wheezing or shortness of breath.    Dispense:  17 each    Refill:  2    No orders of the defined types were placed in this encounter.    Follow-up: Return if symptoms worsen or fail to improve.  An After Visit Summary was printed and given to the patient.  Total time spent on today's visit was 35 minutes, including both face-to-face time and nonface-to-face time personally spent on review of chart (labs and imaging), discussing labs and goals, discussing further work-up, treatment options, referrals to specialist if needed,  reviewing outside records if pertinent, answering patient's questions, and coordinating care.    Lajuana Matte, FNP [facility] [phone]

## 2023-04-17 ENCOUNTER — Encounter: Payer: Self-pay | Admitting: Family Medicine

## 2023-04-17 MED ORDER — OFLOXACIN 0.3 % OT SOLN: 5.0000 [drp] | Freq: Every day | OTIC | 0 refills | Status: AC

## 2023-04-19 ENCOUNTER — Telehealth: Payer: Self-pay | Admitting: Family Medicine

## 2023-04-19 NOTE — Telephone Encounter (Signed)
 Patient called today regarding recent diagnosis with the Flu on Wednesday. She stated that she was previously treated for a sinus infection prior to the flu. She has a cough, rhinorrhea, no taste and right ear feels full causing her to have some dizziness. Her cough is productive with yellow sputum. She has taken Mucinex and cough medication that she was previously prescribed. I advised her to start taking zyrtec and Flonase for her ear and to call me back tomorrow if it doesn't resolve and I would send her some antibiotic ear drops. Her sinus infection along with the flu infection may have exacerbated her ear drainage. She insisted that I send the ear drops just in case she needs it tomorrow. Sent to [facility].

## 2023-05-24 ENCOUNTER — Other Ambulatory Visit: Payer: Self-pay

## 2023-05-24 MED ORDER — NYSTATIN 100000 UNIT/GM EX CREA: 1.0000 | TOPICAL_CREAM | Freq: Two times a day (BID) | CUTANEOUS | 2 refills | Status: DC

## 2023-05-31 ENCOUNTER — Ambulatory Visit: Payer: Medicare HMO | Admitting: Cardiology

## 2023-06-02 ENCOUNTER — Ambulatory Visit: Admitting: Physician Assistant

## 2023-06-02 ENCOUNTER — Encounter: Payer: Self-pay | Admitting: Physician Assistant

## 2023-06-02 VITALS — BP 122/88 | HR 82 | Temp 98.0°F | Ht 59.0 in | Wt 201.4 lb

## 2023-06-02 DIAGNOSIS — M4316 Spondylolisthesis, lumbar region: Secondary | ICD-10-CM | POA: Insufficient documentation

## 2023-06-02 DIAGNOSIS — Z87891 Personal history of nicotine dependence: Secondary | ICD-10-CM | POA: Diagnosis not present

## 2023-06-02 DIAGNOSIS — Z1231 Encounter for screening mammogram for malignant neoplasm of breast: Secondary | ICD-10-CM | POA: Diagnosis not present

## 2023-06-02 DIAGNOSIS — M79604 Pain in right leg: Secondary | ICD-10-CM

## 2023-06-02 DIAGNOSIS — M47816 Spondylosis without myelopathy or radiculopathy, lumbar region: Secondary | ICD-10-CM | POA: Insufficient documentation

## 2023-06-02 MED ORDER — DICLOFENAC SODIUM 75 MG PO TBEC
75.0000 mg | DELAYED_RELEASE_TABLET | Freq: Two times a day (BID) | ORAL | 2 refills | Status: DC
Start: 2023-06-02 — End: 2023-09-02

## 2023-06-02 NOTE — Progress Notes (Signed)
 Acute Office Visit  Subjective:    Patient ID: Sherry Montgomery, female    DOB: 08/27/1960, 63 y.o.   MRN: [id number]  Chief Complaint  Patient presents with   Medical Management of Chronic Issues    HPI: Patient is in today for complaints of right leg and knee pain.  She states she started having pain above her right ankle about a week ago and that resolved.  She then started having some pain and discomfort to right medial knee.  Says tender with certain movements.  Has actually improved over the past few days.  Denies redness or swelling of leg.  She is currently following with ortho for chronic low back pain and thinks her leg pain stems from having to adjust her gait and bear weight differently because of her low back pain  Pt will be due for mammogram in June and wants to schedule  Pt is due for CT lung screening - would like to schedule   Current Outpatient Medications:    diclofenac (VOLTAREN) 75 MG EC tablet, Take 1 tablet (75 mg total) by mouth 2 (two) times daily., Disp: 60 tablet, Rfl: 2   nystatin cream (MYCOSTATIN), Apply 1 Application topically as needed for dry skin., Disp: , Rfl:    pravastatin (PRAVACHOL) 40 MG tablet, Take 1 tablet (40 mg total) by mouth daily., Disp: 90 tablet, Rfl: 3   albuterol (VENTOLIN HFA) 108 (90 Base) MCG/ACT inhaler, Inhale 2 puffs into the lungs every 6 (six) hours as needed for wheezing or shortness of breath. (Patient not taking: Reported on 06/02/2023), Disp: 17 each, Rfl: 2   cetirizine (ZYRTEC) 10 MG tablet, Take 10 mg by mouth daily. (Patient not taking: Reported on 06/02/2023), Disp: , Rfl:    diazepam (VALIUM) 2 MG tablet, Take 1 tablet (2 mg total) by mouth every 6 (six) hours as needed. (Patient not taking: Reported on 06/02/2023), Disp: 30 tablet, Rfl: 0   Vitamin D, Ergocalciferol, (DRISDOL) 1.25 MG (50000 UNIT) CAPS capsule, Take 1 capsule (50,000 Units total) by mouth every 7 (seven) days. (Patient not taking: Reported on  06/02/2023), Disp: 5 capsule, Rfl: 5  Allergies  Allergen Reactions   Codeine Anaphylaxis    Throat swelling Throat swelling Throat swelling    Shellfish Allergy Anaphylaxis    Other reaction(s): Shock (ALLERGY) Other reaction(s): Shock (ALLERGY)    Ciprofloxacin Nausea And Vomiting   Erythromycin    Ibuprofen Other (See Comments)    Other reaction(s): Sweating (intolerance) Other reaction(s): Sweating (intolerance)    Prednisone Other (See Comments) and Hypertension   Silver Nitrate Swelling   Sulfa Antibiotics    Sulfacetamide    Tramadol Other (See Comments)    ask ask ask    Tramadol Hcl    Acetaminophen Rash   Ampicillin Rash   Cefdinir Rash   Cephalexin Rash   Doxycycline Rash   Fish-Derived Products Rash   Hydrocodone-Acetaminophen Nausea And Vomiting and Rash    ask ask ask    Sulfamethoxazole Rash    ROS CONSTITUTIONAL: Negative for chills, fatigue, fever,   CARDIOVASCULAR: Negative for chest pain, dizziness, palpitations and pedal edema.  RESPIRATORY: Negative for recent cough and dyspnea.   MSK: see HPI INTEGUMENTARY: Negative for rash.       Objective:    PHYSICAL EXAM:   BP 122/88   Pulse 82   Temp 98 F (36.7 C)   Ht 4\' 11"  (1.499 m)   Wt 201 lb 6.4 oz (91.4 kg)  LMP  (LMP Unknown)   SpO2 98%   BMI 40.68 kg/m    GEN: Well nourished, well developed, in no acute distress   Cardiac: RRR; no murmurs, rubs, or gallops,no edema - no significant varicosities Respiratory:  normal respiratory rate and pattern with no distress - normal breath sounds with no rales, rhonchi, wheezes or rubs  MS: no deformity or atrophy - tender to medial side of right knee - rom normal - no edema or erythema Skin: warm and dry, no rash       Assessment & Plan:    Pain of right lower extremity -     Diclofenac Sodium; Take 1 tablet (75 mg total) by mouth 2 (two) times daily.  Dispense: 60 tablet; Refill: 2  Encounter for screening mammogram for  breast cancer -     3D Screening Mammogram, Left and Right; Future  History of tobacco abuse -     CT CHEST LUNG CANCER SCREENING LOW DOSE WO CONTRAST; Future     Follow-up: Return for as scheduled for chronic visit.  An After Visit Summary was printed and given to the patient.  Anthonette Bastos [facility] [phone]

## 2023-06-14 ENCOUNTER — Telehealth: Payer: Self-pay | Admitting: Family Medicine

## 2023-06-14 NOTE — Telephone Encounter (Signed)
 Patient called regarding severe abdominal pain and a recent stressful situation that triggered a fibromyalgia flare and IBS flare. Patient denies constipation and fever. Advised patient to go to the hospital for possible imaging and UA.

## 2023-06-15 ENCOUNTER — Encounter: Payer: Self-pay | Admitting: Physician Assistant

## 2023-07-02 ENCOUNTER — Ambulatory Visit: Payer: Self-pay | Admitting: *Deleted

## 2023-07-02 ENCOUNTER — Encounter: Payer: Self-pay | Admitting: Family Medicine

## 2023-07-02 ENCOUNTER — Telehealth: Admitting: Family Medicine

## 2023-07-02 DIAGNOSIS — J301 Allergic rhinitis due to pollen: Secondary | ICD-10-CM

## 2023-07-02 MED ORDER — AZELASTINE HCL 0.1 % NA SOLN: 2.0000 | Freq: Every day | NASAL | 12 refills | Status: DC

## 2023-07-02 NOTE — Telephone Encounter (Signed)
  Chief Complaint: dizziness, ear pain- left Symptoms: lightheaded,dizzy, ear pain- patient states she can not get up without feeling dizzy Frequency: started today Pertinent Negatives: Patient denies nausea,vomiting Disposition: [] ED /[] Urgent Care (no appt availability in office) / [x] Appointment(In office/virtual)/ []  [facility]/ [] Home Care/ [] Refused Recommended Disposition /[] [facility]/ []  Follow-up with PCP Additional Notes: Patient is requesting appointment- virtually- she does not want to attempt car ride   Copied from CRM [id number]. Topic: Clinical - Red Word Triage >> Jul 02, 2023  9:09 AM Elle L wrote: Red Word that prompted transfer to Nurse Triage: The patient is having vertigo and states she cannot get into the office for an appointment. Reason for Disposition  [1] MODERATE dizziness (e.g., vertigo; feels very unsteady, interferes with normal activities) AND [2] has NOT been evaluated by doctor (or NP/PA) for this  Answer Assessment - Initial Assessment Questions 1. DESCRIPTION: "Describe your dizziness."     Lightheaded and dizzy when moving head, ear pain 2. VERTIGO: "Do you feel like either you or the room is spinning or tilting?"      Movement makes her lightheaded- no spinning 3. LIGHTHEADED: "Do you feel lightheaded?" (e.g., somewhat faint, woozy, weak upon standing)     lightheaded 4. SEVERITY: "How bad is it?"  "Can you walk?"   - MILD: Feels slightly dizzy and unsteady, but is walking normally.   - MODERATE: Feels unsteady when walking, but not falling; interferes with normal activities (e.g., school, work).   - SEVERE: Unable to walk without falling, or requires assistance to walk without falling.     moderate 5. ONSET:  "When did the dizziness begin?"     This morning 6. AGGRAVATING FACTORS: "Does anything make it worse?" (e.g., standing, change in head position)     Head movement 7. CAUSE: "What do you think is causing the dizziness?"      Patient went back to sleep this morning- when she woke she felt weird and dizzy 8. RECURRENT SYMPTOM: "Have you had dizziness before?" If Yes, ask: "When was the last time?" "What happened that time?"     Frequent ear infection- worse this time 9. OTHER SYMPTOMS: "Do you have any other symptoms?" (e.g., headache, weakness, numbness, vomiting, earache)     Earache- left- started today  Protocols used: Dizziness - Vertigo-A-AH

## 2023-07-02 NOTE — Progress Notes (Addendum)
 Acute Office Visit  Subjective:    Patient ID: Sherry Montgomery, female    DOB: [birth year], 63 y.o.   MRN: [id number]  Chief Complaint  Patient presents with   Dizziness    Fluid in ear   Video Visit Video connection was lost when less than 50% of the duration of the visit was complete, at which time the remainder of the visit was completed via audio only.   Patient location: Home Provider location: Office/clinic  Discussed the use of AI scribe software for clinical note transcription with the patient, who gave verbal consent to proceed.  History of Present Illness   Sherry Montgomery is a 63 year old female who presents with ear fullness and allergy symptoms.  She experiences ear fullness and symptoms suggestive of allergies. She has not been taking any allergy medications regularly, although she has Zyrtec available. She has not used Zyrtec yet because it makes her sleepy. She also has Flonase  but has not used it either.   No dizziness, sore throat, fever, sinus pain, pressure, forehead pain, under-eye pain, nasal congestion, cough, trouble breathing, wheezing, itching, watering eyes, or sneezing.  She has not been prescribed any medication for vertigo, such as meclizine , and does not experience spinning sensations.  She has not identified any specific allergy triggers in her home, such as smoke or pets, and attributes her symptoms to recent outdoor activities. She has not used any anti-inflammatories and is unsure if she is allowed to take them.       Past Medical History:  Diagnosis Date   Arthritis    Asthma    Diverticulitis    Emphysema, unspecified (HCC) 2024   Noted on CT imaging 2024   Heartburn    History of COVID-19    Kidney stones    Migraine    Prediabetes    Thoracic aortic atherosclerosis (HCC) 2024   Noted on CT imaging 2024   Tobacco abuse     Past Surgical History:  Procedure Laterality Date   APPENDECTOMY  1985   disc fusion in neck  2022    GALLBLADDER SURGERY  1993   HERNIA REPAIR  1979   ROTATOR CUFF REPAIR  2016   TOTAL ABDOMINAL HYSTERECTOMY  1984    Family History  Problem Relation Age of Onset   Alzheimer's disease Mother    Lupus Father    Lung cancer Sister    Leukemia Sister    Alzheimer's disease Sister    Alzheimer's disease Brother    Breast cancer Neg Hx     Social History   Socioeconomic History   Marital status: Married    Spouse name: Not on file   Number of children: 2   Years of education: Not on file   Highest education level: Not on file  Occupational History   Not on file  Tobacco Use   Smoking status: Former    Average packs/day: 1 pack/day for 50.7 years (50.7 ttl pk-yrs)    Types: Cigarettes    Start date: 1974   Smokeless tobacco: Never  Vaping Use   Vaping status: Every Day  Substance and Sexual Activity   Alcohol use: Never   Drug use: Never   Sexual activity: Yes    Partners: Male  Other Topics Concern   Not on file  Social History Narrative   Not on file   Social Drivers of Health   Financial Resource Strain: Low Risk  (10/05/2022)   Overall Financial Resource  Strain (CARDIA)    Difficulty of Paying Living Expenses: Not hard at all  Food Insecurity: No Food Insecurity (10/05/2022)   Hunger Vital Sign    Worried About Running Out of Food in the Last Year: Never true    Ran Out of Food in the Last Year: Never true  Transportation Needs: No Transportation Needs (10/05/2022)   PRAPARE - Administrator, Civil Service (Medical): No    Lack of Transportation (Non-Medical): No  Physical Activity: Inactive (10/05/2022)   Exercise Vital Sign    Days of Exercise per Week: 0 days    Minutes of Exercise per Session: 0 min  Stress: No Stress Concern Present (10/05/2022)   [employer] of Occupational Health - Occupational Stress Questionnaire    Feeling of Stress : Not at all  Social Connections: Moderately Integrated (10/05/2022)   Social Connection and  Isolation Panel [NHANES]    Frequency of Communication with Friends and Family: More than three times a week    Frequency of Social Gatherings with Friends and Family: Three times a week    Attends Religious Services: More than 4 times per year    Active Member of Clubs or Organizations: No    Attends Banker Meetings: Never    Marital Status: Married  Catering manager Violence: Not At Risk (10/05/2022)   Humiliation, Afraid, Rape, and Kick questionnaire    Fear of Current or Ex-Partner: No    Emotionally Abused: No    Physically Abused: No    Sexually Abused: No    Outpatient Medications Prior to Visit  Medication Sig Dispense Refill   diclofenac  (VOLTAREN ) 75 MG EC tablet Take 1 tablet (75 mg total) by mouth 2 (two) times daily. 60 tablet 2   nystatin  cream (MYCOSTATIN ) Apply 1 Application topically as needed for dry skin.     pravastatin  (PRAVACHOL ) 40 MG tablet Take 1 tablet (40 mg total) by mouth daily. 90 tablet 3   albuterol  (VENTOLIN  HFA) 108 (90 Base) MCG/ACT inhaler Inhale 2 puffs into the lungs every 6 (six) hours as needed for wheezing or shortness of breath. (Patient not taking: Reported on 07/02/2023) 17 each 2   cetirizine (ZYRTEC) 10 MG tablet Take 10 mg by mouth daily. (Patient not taking: Reported on 07/02/2023)     diazepam  (VALIUM ) 2 MG tablet Take 1 tablet (2 mg total) by mouth every 6 (six) hours as needed. (Patient not taking: Reported on 07/02/2023) 30 tablet 0   naproxen sodium (ALEVE) 220 MG tablet Take 220 mg by mouth daily as needed.     Vitamin D , Ergocalciferol , (DRISDOL ) 1.25 MG (50000 UNIT) CAPS capsule Take 1 capsule (50,000 Units total) by mouth every 7 (seven) days. (Patient not taking: Reported on 07/02/2023) 5 capsule 5   No facility-administered medications prior to visit.    Allergies  Allergen Reactions   Codeine Anaphylaxis and Other (See Comments)    Throat swelling  Throat swelling, Throat swelling    Throat swelling Throat  swelling Throat swelling    Throat swelling Throat swelling    Throat swelling   Prednisone Hypertension and Other (See Comments)   Shellfish Allergy Anaphylaxis    Other reaction(s): Shock (ALLERGY)  Other reaction(s): Shock (ALLERGY) Other reaction(s): Shock (ALLERGY)   Acetaminophen Rash and Dermatitis   Ampicillin Rash and Dermatitis   Cefdinir Rash and Dermatitis   Ciprofloxacin Nausea And Vomiting and Other (See Comments)   Doxycycline Rash and Dermatitis   Erythromycin Other (See Comments)  Fish-Derived Products Rash and Dermatitis   Hydrocodone-Acetaminophen Nausea And Vomiting, Rash, Dermatitis and Other (See Comments)    ask  ask, ask    ask ask ask    ask ask    ask   Ibuprofen Other (See Comments)    Other reaction(s): Sweating (intolerance)  Reaction: Sweating (intolerance); Other reaction(s): Sweating (intolerance)     Other reaction(s): Sweating (intolerance) Other reaction(s): Sweating (intolerance)    Other reaction(s): Sweating (intolerance)   Silver Nitrate Swelling   Sulfa Antibiotics Dermatitis   Tramadol Other (See Comments)    ask  ask, ask    ask ask ask    ask ask    ask   Sulfacetamide    Tramadol Hcl    Cephalexin Rash   Sulfamethoxazole Rash    Review of Systems  Constitutional:  Negative for chills, diaphoresis, fatigue and fever.  HENT:  Negative for congestion, ear pain, sinus pressure, sinus pain and sore throat.        Ear fullness  Respiratory:  Negative for cough and shortness of breath.   Cardiovascular:  Negative for chest pain.  Gastrointestinal:  Negative for abdominal pain, constipation, nausea and vomiting.  Genitourinary:  Negative for dysuria.  Musculoskeletal:  Negative for arthralgias.  Allergic/Immunologic: Positive for environmental allergies.  Neurological:  Positive for dizziness and light-headedness. Negative for weakness and headaches.  Psychiatric/Behavioral:  Negative for dysphoric mood. The patient  is not nervous/anxious.        Objective:         06/02/2023   10:24 AM 04/09/2023    7:45 AM 02/03/2023    8:08 AM  Vitals with BMI  Height 4\' 11"  4\' 11"  4\' 11"   Weight 201 lbs 6 oz 202 lbs 197 lbs  BMI 40.66 40.78 39.77  Systolic 122 122 657  Diastolic 88 80 60  Pulse 82 77 63    No data found.   Physical Exam Neurological:     General: No focal deficit present.     Mental Status: She is alert.  Psychiatric:        Mood and Affect: Mood normal.        Thought Content: Thought content normal.   - Due to platform of visit today, I was unable to perform a physical exam.  Health Maintenance Due  Topic Date Due   Diabetic kidney evaluation - Urine ACR  12/05/2022   Lung Cancer Screening  04/08/2023    There are no preventive care reminders to display for this patient.   Lab Results  Component Value Date   TSH 1.740 03/23/2022   Lab Results  Component Value Date   WBC 7.8 02/01/2023   HGB 15.0 02/01/2023   HCT 45.6 02/01/2023   MCV 95 02/01/2023   PLT 290 02/01/2023   Lab Results  Component Value Date   NA 141 02/01/2023   K 4.6 02/01/2023   CO2 21 02/01/2023   GLUCOSE 110 (H) 02/01/2023   BUN 14 02/01/2023   CREATININE 0.91 02/01/2023   BILITOT 0.3 02/01/2023   ALKPHOS 67 02/01/2023   AST 23 02/01/2023   ALT 19 02/01/2023   PROT 7.3 02/01/2023   ALBUMIN 3.9 02/01/2023   CALCIUM 8.8 02/01/2023   EGFR 72 02/01/2023   Lab Results  Component Value Date   CHOL 167 02/01/2023   Lab Results  Component Value Date   HDL 37 (L) 02/01/2023   Lab Results  Component Value Date   LDLCALC 101 (H) 02/01/2023  Lab Results  Component Value Date   TRIG 162 (H) 02/01/2023   Lab Results  Component Value Date   CHOLHDL 4.5 (H) 02/01/2023   Lab Results  Component Value Date   HGBA1C 6.3 (H) 02/01/2023       Assessment & Plan:  Seasonal allergic rhinitis due to pollen Assessment & Plan: Symptoms of ear fullness and nasal congestion without  fever or cough. Likely exacerbated by outdoor activities. No infection present. Discussed Flonase , Zyrtec, and Astelin  for management. Benadryl noted for drowsiness risk. Advised against steroids and antibiotics unless significant symptom worsening. - Prescribed Astelin  nasal spray to [facility]. - Advised use of Zyrtec as needed. - Recommended neti pot for nasal irrigation. - Encouraged increased fluid intake and rest.   Orders: -     Azelastine  HCl; Place 2 sprays into both nostrils daily. Use in each nostril as directed  Dispense: 30 mL; Refill: 12     Meds ordered this encounter  Medications   azelastine  (ASTELIN ) 0.1 % nasal spray    Sig: Place 2 sprays into both nostrils daily. Use in each nostril as directed    Dispense:  30 mL    Refill:  12    No orders of the defined types were placed in this encounter.    Follow-up: Return if symptoms worsen or fail to improve.  An After Visit Summary was printed and given to the patient.  Time spent on the video visit - 37m 50s   Delford Felling, FNP [facility] [phone]

## 2023-07-02 NOTE — Assessment & Plan Note (Signed)
 Symptoms of ear fullness and nasal congestion without fever or cough. Likely exacerbated by outdoor activities. No infection present. Discussed Flonase , Zyrtec, and Astelin for management. Benadryl noted for drowsiness risk. Advised against steroids and antibiotics unless significant symptom worsening. - Prescribed Astelin nasal spray to [facility]. - Advised use of Zyrtec as needed. - Recommended neti pot for nasal irrigation. - Encouraged increased fluid intake and rest.

## 2023-08-06 ENCOUNTER — Ambulatory Visit: Payer: Medicare HMO | Admitting: Physician Assistant

## 2023-08-13 ENCOUNTER — Encounter: Payer: Self-pay | Admitting: Cardiology

## 2023-08-16 ENCOUNTER — Ambulatory Visit: Payer: Self-pay | Admitting: Cardiology

## 2023-08-30 ENCOUNTER — Other Ambulatory Visit: Payer: Self-pay | Admitting: Physician Assistant

## 2023-08-30 DIAGNOSIS — E1165 Type 2 diabetes mellitus with hyperglycemia: Secondary | ICD-10-CM

## 2023-08-30 DIAGNOSIS — F419 Anxiety disorder, unspecified: Secondary | ICD-10-CM

## 2023-08-30 DIAGNOSIS — E559 Vitamin D deficiency, unspecified: Secondary | ICD-10-CM

## 2023-08-30 DIAGNOSIS — E1169 Type 2 diabetes mellitus with other specified complication: Secondary | ICD-10-CM

## 2023-08-31 ENCOUNTER — Other Ambulatory Visit

## 2023-09-02 ENCOUNTER — Ambulatory Visit: Payer: Self-pay

## 2023-09-02 ENCOUNTER — Ambulatory Visit: Admitting: Physician Assistant

## 2023-09-02 VITALS — BP 100/80 | HR 73 | Ht 59.0 in | Wt 203.0 lb

## 2023-09-02 DIAGNOSIS — E782 Mixed hyperlipidemia: Secondary | ICD-10-CM | POA: Diagnosis not present

## 2023-09-02 DIAGNOSIS — I700 Atherosclerosis of aorta: Secondary | ICD-10-CM | POA: Diagnosis not present

## 2023-09-02 DIAGNOSIS — Z720 Tobacco use: Secondary | ICD-10-CM | POA: Diagnosis not present

## 2023-09-02 NOTE — Patient Instructions (Signed)

## 2023-09-02 NOTE — Assessment & Plan Note (Signed)
 Discussed about harmful effects of smoking. She was able to quit for over 2 months but fell back into smoking up to half pack a day.  She understands the harmful effects and is motivated to quit and will work on it again.

## 2023-09-02 NOTE — Assessment & Plan Note (Signed)
 Noted on prior CT chest imaging. Significance of plaque burden and importance of lipid management and diet and lifestyle including quitting smoking reviewed in the setting.

## 2023-09-02 NOTE — Progress Notes (Signed)
 Cardiology Consultation:    Date:  09/02/2023   ID:  Sherry Montgomery, DOB [birth year], MRN [id number]  PCP:  Nicholaus Credit, PA-C  Cardiologist:  Alean SAUNDERS Janelis Stelzer, MD   Referring MD: Nicholaus Credit, PA-C   Chief Complaint  Patient presents with   Follow-up     ASSESSMENT AND PLAN:   Sherry Montgomery 63 year old woman  history of aortic atherosclerosis on CT chest imaging, prediabetes, hyperlipidemia, tobacco use [smoking half pack a day], emphysema, fibromyalgia, chronic back pain. Echocardiogram 07/28/2022 noted normal biventricular function with EF 60 to 65% and mild MR. Stress test 10/29/2022 noted no ischemia.  Here for follow-up visit. Problem List Items Addressed This Visit     Mixed hyperlipidemia - Primary   Last lipid panel from 02/01/2023 total cholesterol 167, HDL 37, LDL 101, triglycerides 162.  Continue with pravastatin  40 mg once daily. Tolerating well.       Tobacco abuse   Discussed about harmful effects of smoking. She was able to quit for over 2 months but fell back into smoking up to half pack a day.  She understands the harmful effects and is motivated to quit and will work on it again.       Aortic atherosclerosis (HCC)   Noted on prior CT chest imaging. Significance of plaque burden and importance of lipid management and diet and lifestyle including quitting smoking reviewed in the setting.        Return to clinic tentatively in 1 year or as needed.    History of Present Illness:    Sherry Montgomery is a 64 y.o. female who is being seen today for follow-up visit. PCP is Nicholaus Credit, PA-C. Last visit in the office was 10/26/2022 with Delon Hoover, NP-C.  Pleasant woman here for the visit by herself.   Has history of aortic atherosclerosis on CT chest imaging, prediabetes, hyperlipidemia, tobacco use [smoking half pack a day], emphysema, fibromyalgia, chronic back pain. Echocardiogram 07/28/2022 noted normal biventricular function with EF 60  to 65% and mild MR. Stress test 10/29/2022 noted no ischemia.    Mentions overall no significant cardiac symptoms. Denies any chest pain, shortness of breath, orthopnea paroxysmal nocturnal dyspnea.  Functional status is significantly limited due to her back pain.  Following up with surgeons but her surgery has been declined by insurance couple times and is waiting on another appeal.  At home she has a aboveground swimming pool and planning to continue with water exercises.  She unfortunately has fallen back on smoking over the past few months, up to half pack a day.  Past Medical History:  Diagnosis Date   Arthritis    Asthma    Diverticulitis    Emphysema, unspecified (HCC) 2024   Noted on CT imaging 2024   Heartburn    History of COVID-19    Kidney stones    Migraine    Prediabetes    Thoracic aortic atherosclerosis (HCC) 2024   Noted on CT imaging 2024   Tobacco abuse     Past Surgical History:  Procedure Laterality Date   APPENDECTOMY  1985   disc fusion in neck  2022   GALLBLADDER SURGERY  1993   HERNIA REPAIR  1979   ROTATOR CUFF REPAIR  2016   TOTAL ABDOMINAL HYSTERECTOMY  1984    Current Medications: Current Meds  Medication Sig   pravastatin  (PRAVACHOL ) 40 MG tablet Take 1 tablet (40 mg total) by mouth daily.     Allergies:   Codeine,  Prednisone, Shellfish allergy, Acetaminophen, Ampicillin, Cefdinir, Ciprofloxacin, Doxycycline, Erythromycin, Fish-derived products, Hydrocodone-acetaminophen, Ibuprofen, Silver nitrate, Sulfa antibiotics, Tramadol, Sulfacetamide, Tramadol hcl, Cephalexin, and Sulfamethoxazole   Social History   Socioeconomic History   Marital status: Married    Spouse name: Not on file   Number of children: 2   Years of education: Not on file   Highest education level: Not on file  Occupational History   Not on file  Tobacco Use   Smoking status: Every Day    Average packs/day: 1 pack/day for 50.7 years (50.7 ttl pk-yrs)    Types:  Cigarettes    Start date: 54   Smokeless tobacco: Never  Vaping Use   Vaping status: Every Day  Substance and Sexual Activity   Alcohol use: Never   Drug use: Never   Sexual activity: Yes    Partners: Male  Other Topics Concern   Not on file  Social History Narrative   Not on file   Social Drivers of Health   Financial Resource Strain: Low Risk  (10/05/2022)   Overall Financial Resource Strain (CARDIA)    Difficulty of Paying Living Expenses: Not hard at all  Food Insecurity: No Food Insecurity (10/05/2022)   Hunger Vital Sign    Worried About Running Out of Food in the Last Year: Never true    Ran Out of Food in the Last Year: Never true  Transportation Needs: No Transportation Needs (10/05/2022)   PRAPARE - Administrator, Civil Service (Medical): No    Lack of Transportation (Non-Medical): No  Physical Activity: Inactive (10/05/2022)   Exercise Vital Sign    Days of Exercise per Week: 0 days    Minutes of Exercise per Session: 0 min  Stress: No Stress Concern Present (10/05/2022)   [employer] of Occupational Health - Occupational Stress Questionnaire    Feeling of Stress : Not at all  Social Connections: Moderately Integrated (10/05/2022)   Social Connection and Isolation Panel    Frequency of Communication with Friends and Family: More than three times a week    Frequency of Social Gatherings with Friends and Family: Three times a week    Attends Religious Services: More than 4 times per year    Active Member of Clubs or Organizations: No    Attends Banker Meetings: Never    Marital Status: Married     Family History: The patient's family history includes Alzheimer's disease in her brother, mother, and sister; Leukemia in her sister; Lung cancer in her sister; Lupus in her father. There is no history of Breast cancer. ROS:   Please see the history of present illness.    All 14 point review of systems negative except as described per  history of present illness.  EKGs/Labs/Other Studies Reviewed:    The following studies were reviewed today:   EKG:       Recent Labs: 02/01/2023: ALT 19; BUN 14; Creatinine, Ser 0.91; Hemoglobin 15.0; Platelets 290; Potassium 4.6; Sodium 141  Recent Lipid Panel    Component Value Date/Time   CHOL 167 02/01/2023 0736   TRIG 162 (H) 02/01/2023 0736   HDL 37 (L) 02/01/2023 0736   CHOLHDL 4.5 (H) 02/01/2023 0736   LDLCALC 101 (H) 02/01/2023 0736    Physical Exam:    VS:  BP 100/80   Pulse 73   Ht 4' 11 (1.499 m)   Wt 203 lb (92.1 kg)   LMP  (LMP Unknown)   SpO2 97%  BMI 41.00 kg/m     Wt Readings from Last 3 Encounters:  09/02/23 203 lb (92.1 kg)  06/02/23 201 lb 6.4 oz (91.4 kg)  04/09/23 202 lb (91.6 kg)     GENERAL:  Well nourished, well developed in no acute distress NECK: No JVD; No carotid bruits CARDIAC: RRR, S1 and S2 present, no murmurs, no rubs, no gallops CHEST:  Clear to auscultation without rales, wheezing or rhonchi  Extremities: No pitting pedal edema. Pulses bilaterally symmetric with radial 2+ and dorsalis pedis 2+ NEUROLOGIC:  Alert and oriented x 3  Medication Adjustments/Labs and Tests Ordered: Current medicines are reviewed at length with the patient today.  Concerns regarding medicines are outlined above.  No orders of the defined types were placed in this encounter.  No orders of the defined types were placed in this encounter.   Signed, Alean jess Kobus, MD, MPH, [facility]. 09/02/2023 3:02 PM    [facility]

## 2023-09-02 NOTE — Assessment & Plan Note (Signed)
 Last lipid panel from 02/01/2023 total cholesterol 167, HDL 37, LDL 101, triglycerides 162.  Continue with pravastatin  40 mg once daily. Tolerating well.

## 2023-09-03 DIAGNOSIS — M7661 Achilles tendinitis, right leg: Secondary | ICD-10-CM | POA: Diagnosis not present

## 2023-09-07 ENCOUNTER — Ambulatory Visit (INDEPENDENT_AMBULATORY_CARE_PROVIDER_SITE_OTHER): Admitting: Physician Assistant

## 2023-09-07 VITALS — BP 128/82 | HR 66 | Temp 97.1°F | Ht 59.0 in | Wt 202.0 lb

## 2023-09-07 DIAGNOSIS — E1165 Type 2 diabetes mellitus with hyperglycemia: Secondary | ICD-10-CM

## 2023-09-07 DIAGNOSIS — Z87891 Personal history of nicotine dependence: Secondary | ICD-10-CM | POA: Insufficient documentation

## 2023-09-07 DIAGNOSIS — N959 Unspecified menopausal and perimenopausal disorder: Secondary | ICD-10-CM | POA: Diagnosis not present

## 2023-09-07 DIAGNOSIS — G8929 Other chronic pain: Secondary | ICD-10-CM

## 2023-09-07 DIAGNOSIS — E1169 Type 2 diabetes mellitus with other specified complication: Secondary | ICD-10-CM | POA: Diagnosis not present

## 2023-09-07 DIAGNOSIS — M2550 Pain in unspecified joint: Secondary | ICD-10-CM | POA: Diagnosis not present

## 2023-09-07 DIAGNOSIS — E785 Hyperlipidemia, unspecified: Secondary | ICD-10-CM

## 2023-09-07 DIAGNOSIS — I700 Atherosclerosis of aorta: Secondary | ICD-10-CM | POA: Diagnosis not present

## 2023-09-07 DIAGNOSIS — M65312 Trigger thumb, left thumb: Secondary | ICD-10-CM | POA: Diagnosis not present

## 2023-09-07 DIAGNOSIS — F419 Anxiety disorder, unspecified: Secondary | ICD-10-CM | POA: Diagnosis not present

## 2023-09-07 DIAGNOSIS — E559 Vitamin D deficiency, unspecified: Secondary | ICD-10-CM

## 2023-09-07 DIAGNOSIS — J438 Other emphysema: Secondary | ICD-10-CM | POA: Insufficient documentation

## 2023-09-07 NOTE — Progress Notes (Signed)
 Established Patient Office Visit  Subjective:  Patient ID: Sherry Montgomery, female    DOB: Jun 26, 1960  Age: 63 y.o. MRN: [id number]  CC:  Chief Complaint  Patient presents with   Medical Management of Chronic Issues    HPI Sherry Montgomery presents for chronic follow up  Pt with history of diabetes - had hgb A1c of 6.3 recently (with highest reading at 6.7)  She has currently managed with diet - is trying to decrease carbs/sugars Voices no concerns or problems  Pt with diagnosis of vit D def - recently had labwork that showed Vit d low - is not taking supplement  Mixed hyperlipidemia  Pt presents with hyperlipidemia. Compliance with treatment has been good The patient is compliant with medications, maintains a low cholesterol diet , follows up as directed , and maintains an exercise regimen . The patient denies experiencing any hypercholesterolemia related symptoms. Currently taking pravachol  40mg   Pt would like to schedule dexa scan  Pt is having trouble with left thumb becoming 'stuck' - consistent with trigger finger  Pt would like arthritis panel drawn - she has chronic joint pains and known osteoarthritis Past Medical History:  Diagnosis Date   Arthritis    Asthma    Diverticulitis    Emphysema, unspecified (HCC) 2024   Noted on CT imaging 2024   Heartburn    History of COVID-19    Kidney stones    Migraine    Prediabetes    Thoracic aortic atherosclerosis (HCC) 2024   Noted on CT imaging 2024   Tobacco abuse     Past Surgical History:  Procedure Laterality Date   APPENDECTOMY  1985   disc fusion in neck  2022   GALLBLADDER SURGERY  1993   HERNIA REPAIR  1979   ROTATOR CUFF REPAIR  2016   TOTAL ABDOMINAL HYSTERECTOMY  1984    Family History  Problem Relation Age of Onset   Alzheimer's disease Mother    Lupus Father    Lung cancer Sister    Leukemia Sister    Alzheimer's disease Sister    Alzheimer's disease Brother    Breast cancer Neg Hx      Social History   Socioeconomic History   Marital status: Married    Spouse name: Not on file   Number of children: 2   Years of education: Not on file   Highest education level: 12th grade  Occupational History   Not on file  Tobacco Use   Smoking status: Every Day    Average packs/day: 1 pack/day for 50.7 years (50.7 ttl pk-yrs)    Types: Cigarettes    Start date: 15   Smokeless tobacco: Never  Vaping Use   Vaping status: Every Day  Substance and Sexual Activity   Alcohol use: Never   Drug use: Never   Sexual activity: Yes    Partners: Male  Other Topics Concern   Not on file  Social History Narrative   Not on file   Social Drivers of Health   Financial Resource Strain: Low Risk  (09/07/2023)   Overall Financial Resource Strain (CARDIA)    Difficulty of Paying Living Expenses: Not hard at all  Food Insecurity: No Food Insecurity (09/07/2023)   Hunger Vital Sign    Worried About Running Out of Food in the Last Year: Never true    Ran Out of Food in the Last Year: Never true  Transportation Needs: No Transportation Needs (09/07/2023)   PRAPARE -  Administrator, Civil Service (Medical): No    Lack of Transportation (Non-Medical): No  Physical Activity: Inactive (09/07/2023)   Exercise Vital Sign    Days of Exercise per Week: 0 days    Minutes of Exercise per Session: Not on file  Stress: No Stress Concern Present (09/07/2023)   [employer] of Occupational Health - Occupational Stress Questionnaire    Feeling of Stress: Not at all  Social Connections: Moderately Integrated (09/07/2023)   Social Connection and Isolation Panel    Frequency of Communication with Friends and Family: More than three times a week    Frequency of Social Gatherings with Friends and Family: Twice a week    Attends Religious Services: More than 4 times per year    Active Member of [employer] or Organizations: No    Attends Banker Meetings: Not on file    Marital  Status: Married  Catering manager Violence: Not At Risk (10/05/2022)   Humiliation, Afraid, Rape, and Kick questionnaire    Fear of Current or Ex-Partner: No    Emotionally Abused: No    Physically Abused: No    Sexually Abused: No     Current Outpatient Medications:    pravastatin  (PRAVACHOL ) 40 MG tablet, Take 1 tablet (40 mg total) by mouth daily., Disp: 90 tablet, Rfl: 3   Allergies  Allergen Reactions   Codeine Anaphylaxis and Other (See Comments)    Throat swelling  Throat swelling, Throat swelling    Throat swelling Throat swelling Throat swelling    Throat swelling Throat swelling    Throat swelling   Prednisone Hypertension and Other (See Comments)   Shellfish Allergy Anaphylaxis    Other reaction(s): Shock (ALLERGY)  Other reaction(s): Shock (ALLERGY) Other reaction(s): Shock (ALLERGY)   Acetaminophen Rash and Dermatitis   Ampicillin Rash and Dermatitis   Cefdinir Rash and Dermatitis   Ciprofloxacin Nausea And Vomiting and Other (See Comments)   Doxycycline Rash and Dermatitis   Erythromycin Other (See Comments)   Fish-Derived Products Rash and Dermatitis   Hydrocodone-Acetaminophen Nausea And Vomiting, Rash, Dermatitis and Other (See Comments)    ask  ask, ask    ask ask ask    ask ask    ask   Ibuprofen Other (See Comments)    Other reaction(s): Sweating (intolerance)  Reaction: Sweating (intolerance); Other reaction(s): Sweating (intolerance)     Other reaction(s): Sweating (intolerance) Other reaction(s): Sweating (intolerance)    Other reaction(s): Sweating (intolerance)   Silver Nitrate Swelling   Sulfa Antibiotics Dermatitis   Tramadol Other (See Comments)    ask  ask, ask    ask ask ask    ask ask    ask   Sulfacetamide    Tramadol Hcl    Cephalexin Rash   Sulfamethoxazole Rash    CONSTITUTIONAL: Negative for chills, fatigue, fever, unintentional weight gain and unintentional weight loss.  E/N/T: Negative for ear pain, nasal  congestion and sore throat.  CARDIOVASCULAR: Negative for chest pain, dizziness, palpitations and pedal edema.  RESPIRATORY: Negative for recent cough and dyspnea.  GASTROINTESTINAL: Negative for abdominal pain, acid reflux symptoms, constipation, diarrhea, nausea and vomiting.  MSK: see HPI INTEGUMENTARY: Negative for rash.  NEUROLOGICAL: Negative for dizziness and headaches.  PSYCHIATRIC: Negative for sleep disturbance and to question depression screen.  Negative for depression, negative for anhedonia.         Objective:  PHYSICAL EXAM:   VS: BP 128/82 (BP Location: Left Arm, Patient Position: Sitting)  Pulse 66   Temp (!) 97.1 F (36.2 C) (Temporal)   Ht 4' 11 (1.499 m)   Wt 202 lb (91.6 kg)   LMP  (LMP Unknown)   SpO2 98%   BMI 40.80 kg/m   GEN: Well nourished, well developed, in no acute distress  Cardiac: RRR; no murmurs, rubs, or gallops,no edema - Respiratory:  normal respiratory rate and pattern with no distress - normal breath sounds with no rales, rhonchi, wheezes or rubs  MS: no deformity or atrophy  Skin: warm and dry, no rash  Neuro:  Alert and Oriented x 3, - CN II-Xii grossly intact Psych: euthymic mood, appropriate affect and demeanor   Health Maintenance Due  Topic Date Due   Diabetic kidney evaluation - Urine ACR  12/05/2022   Lung Cancer Screening  04/08/2023   MAMMOGRAM  07/22/2023   DEXA SCAN  08/01/2023   HEMOGLOBIN A1C  08/02/2023   Medicare Annual Wellness (AWV)  10/05/2023     There are no preventive care reminders to display for this patient.  Lab Results  Component Value Date   TSH 1.740 03/23/2022   Lab Results  Component Value Date   WBC 7.8 02/01/2023   HGB 15.0 02/01/2023   HCT 45.6 02/01/2023   MCV 95 02/01/2023   PLT 290 02/01/2023   Lab Results  Component Value Date   NA 141 02/01/2023   K 4.6 02/01/2023   CO2 21 02/01/2023   GLUCOSE 110 (H) 02/01/2023   BUN 14 02/01/2023   CREATININE 0.91 02/01/2023   BILITOT  0.3 02/01/2023   ALKPHOS 67 02/01/2023   AST 23 02/01/2023   ALT 19 02/01/2023   PROT 7.3 02/01/2023   ALBUMIN 3.9 02/01/2023   CALCIUM 8.8 02/01/2023   EGFR 72 02/01/2023   Lab Results  Component Value Date   CHOL 167 02/01/2023   Lab Results  Component Value Date   HDL 37 (L) 02/01/2023   Lab Results  Component Value Date   LDLCALC 101 (H) 02/01/2023   Lab Results  Component Value Date   TRIG 162 (H) 02/01/2023   Lab Results  Component Value Date   CHOLHDL 4.5 (H) 02/01/2023   Lab Results  Component Value Date   HGBA1C 6.3 (H) 02/01/2023      Assessment & Plan:   Problem List Items Addressed This Visit   None Visit Diagnoses     Type 2 diabetes mellitus with hyperglycemia, without long-term current use of insulin (HCC)    -  Primary Continue to watch diet Urine microalbumin ordered   Hyperlipidemia associated with diabetes (HCC) Watch diet Continue pravachol  40mg  qd   Vitamin D  insufficiency     Vit D level pending  Aortic atherosclerosis (HCC) Lipid panel Continue pravachol   Left thumb trigger finger Exercise with stress ball Aleve as needed Ortho referral when pt ready  Other emphysema (HCC) Recommend stop smoking Pt declines inhalers  Morbid obesity (HCC) Efforts at diet/exercise/weight loss  Menopausal and postmenopausal disorder Dexa scan ordered  Chronic joint pain Arthritis panel pending                                           No orders of the defined types were placed in this encounter.   Follow-up: Return in about 6 months (around 03/09/2024) for chronic fasting follow-up.    SARA R Syeda Prickett, PA-C

## 2023-09-08 ENCOUNTER — Ambulatory Visit: Payer: Self-pay | Admitting: Physician Assistant

## 2023-09-08 LAB — LIPID PANEL
Chol/HDL Ratio: 4 ratio (ref 0.0–4.4)
Cholesterol, Total: 148 mg/dL (ref 100–199)
HDL: 37 mg/dL — ABNORMAL LOW (ref >39)
LDL Chol Calc (NIH): 87 mg/dL (ref 0–99)
Triglycerides: 137 mg/dL (ref 0–149)
VLDL Cholesterol Cal: 24 mg/dL (ref 5–40)

## 2023-09-08 LAB — MICROALBUMIN / CREATININE URINE RATIO
Creatinine, Urine: 137.8 mg/dL
Microalb/Creat Ratio: 3 mg/g{creat} (ref 0–29)
Microalbumin, Urine: 3.9 ug/mL

## 2023-09-08 LAB — HEMOGLOBIN A1C
Est. average glucose Bld gHb Est-mCnc: 140 mg/dL
Hgb A1c MFr Bld: 6.5 % — ABNORMAL HIGH (ref 4.8–5.6)

## 2023-09-08 LAB — C-REACTIVE PROTEIN: CRP: 8 mg/L (ref 0–10)

## 2023-09-08 LAB — CBC WITH DIFFERENTIAL/PLATELET
Basophils Absolute: 0.1 x10E3/uL (ref 0.0–0.2)
Basos: 1 %
EOS (ABSOLUTE): 0.3 x10E3/uL (ref 0.0–0.4)
Eos: 3 %
Hematocrit: 44.1 % (ref 34.0–46.6)
Hemoglobin: 14.8 g/dL (ref 11.1–15.9)
Immature Grans (Abs): 0 x10E3/uL (ref 0.0–0.1)
Immature Granulocytes: 0 %
Lymphocytes Absolute: 2.4 x10E3/uL (ref 0.7–3.1)
Lymphs: 29 %
MCH: 31.8 pg (ref 26.6–33.0)
MCHC: 33.6 g/dL (ref 31.5–35.7)
MCV: 95 fL (ref 79–97)
Monocytes Absolute: 0.8 x10E3/uL (ref 0.1–0.9)
Monocytes: 9 %
Neutrophils Absolute: 4.6 x10E3/uL (ref 1.4–7.0)
Neutrophils: 58 %
Platelets: 208 x10E3/uL (ref 150–450)
RBC: 4.66 x10E6/uL (ref 3.77–5.28)
RDW: 11.8 % (ref 11.7–15.4)
WBC: 8 x10E3/uL (ref 3.4–10.8)

## 2023-09-08 LAB — PARVOVIRUS B19 ANTIBODY, IGG AND IGM
Parvovirus B19 IgG: 6.4 {index} — ABNORMAL HIGH (ref 0.0–0.8)
Parvovirus B19 IgM: 0.1 {index} (ref 0.0–0.8)

## 2023-09-08 LAB — RHEUMATOID FACTOR: Rheumatoid fact SerPl-aCnc: <10 [IU]/mL (ref <14.0)

## 2023-09-08 LAB — COMPREHENSIVE METABOLIC PANEL WITH GFR
ALT: 19 IU/L (ref 0–32)
AST: 24 IU/L (ref 0–40)
Albumin: 3.9 g/dL (ref 3.9–4.9)
Alkaline Phosphatase: 68 IU/L (ref 44–121)
BUN/Creatinine Ratio: 10 — ABNORMAL LOW (ref 12–28)
BUN: 9 mg/dL (ref 8–27)
Bilirubin Total: 0.4 mg/dL (ref 0.0–1.2)
CO2: 19 mmol/L — ABNORMAL LOW (ref 20–29)
Calcium: 9.2 mg/dL (ref 8.7–10.3)
Chloride: 104 mmol/L (ref 96–106)
Creatinine, Ser: 0.87 mg/dL (ref 0.57–1.00)
Globulin, Total: 3.4 g/dL (ref 1.5–4.5)
Glucose: 110 mg/dL — ABNORMAL HIGH (ref 70–99)
Potassium: 4.5 mmol/L (ref 3.5–5.2)
Sodium: 138 mmol/L (ref 134–144)
Total Protein: 7.3 g/dL (ref 6.0–8.5)
eGFR: 75 mL/min/1.73 (ref >59)

## 2023-09-08 LAB — TSH: TSH: 1.99 u[IU]/mL (ref 0.450–4.500)

## 2023-09-08 LAB — SEDIMENTATION RATE: Sed Rate: 38 mm/h (ref 0–40)

## 2023-09-08 LAB — URIC ACID: Uric Acid: 5.8 mg/dL (ref 3.0–7.2)

## 2023-09-08 LAB — CYCLIC CITRUL PEPTIDE ANTIBODY, IGG/IGA: Cyclic Citrullin Peptide Ab: 15 U (ref 0–19)

## 2023-09-08 LAB — ANA W/REFLEX: Anti Nuclear Antibody (ANA): NEGATIVE

## 2023-09-08 LAB — VITAMIN D 25 HYDROXY (VIT D DEFICIENCY, FRACTURES): Vit D, 25-Hydroxy: 27.4 ng/mL — ABNORMAL LOW (ref 30.0–100.0)

## 2023-09-09 ENCOUNTER — Ambulatory Visit (INDEPENDENT_AMBULATORY_CARE_PROVIDER_SITE_OTHER)
Admission: RE | Admit: 2023-09-09 | Discharge: 2023-09-09 | Disposition: A | Discharge status: Home / Self Care | Source: Ambulatory Visit | Attending: Physician Assistant | Admitting: Physician Assistant

## 2023-09-09 DIAGNOSIS — N959 Unspecified menopausal and perimenopausal disorder: Secondary | ICD-10-CM | POA: Diagnosis not present

## 2023-09-09 DIAGNOSIS — Z780 Asymptomatic menopausal state: Secondary | ICD-10-CM | POA: Diagnosis not present

## 2023-09-09 DIAGNOSIS — M85851 Other specified disorders of bone density and structure, right thigh: Secondary | ICD-10-CM | POA: Diagnosis not present

## 2023-09-09 DIAGNOSIS — M85852 Other specified disorders of bone density and structure, left thigh: Secondary | ICD-10-CM | POA: Diagnosis not present

## 2023-09-14 ENCOUNTER — Ambulatory Visit

## 2023-09-14 ENCOUNTER — Ambulatory Visit
Admission: RE | Admit: 2023-09-14 | Discharge: 2023-09-14 | Disposition: A | Discharge status: Home / Self Care | Source: Ambulatory Visit | Attending: Physician Assistant | Admitting: Physician Assistant

## 2023-09-14 DIAGNOSIS — Z1231 Encounter for screening mammogram for malignant neoplasm of breast: Secondary | ICD-10-CM | POA: Diagnosis not present

## 2023-09-20 ENCOUNTER — Ambulatory Visit: Payer: Self-pay | Admitting: Cardiology

## 2023-09-30 ENCOUNTER — Ambulatory Visit

## 2023-10-19 ENCOUNTER — Ambulatory Visit (INDEPENDENT_AMBULATORY_CARE_PROVIDER_SITE_OTHER): Admitting: Physician Assistant

## 2023-10-19 ENCOUNTER — Encounter: Payer: Self-pay | Admitting: Physician Assistant

## 2023-10-19 ENCOUNTER — Other Ambulatory Visit: Payer: Self-pay

## 2023-10-19 VITALS — BP 100/70 | HR 67 | Temp 97.0°F | Resp 16 | Ht 59.0 in | Wt 202.0 lb

## 2023-10-19 DIAGNOSIS — Z0000 Encounter for general adult medical examination without abnormal findings: Secondary | ICD-10-CM | POA: Diagnosis not present

## 2023-10-19 DIAGNOSIS — Z87891 Personal history of nicotine dependence: Secondary | ICD-10-CM

## 2023-10-19 MED ORDER — NYSTATIN 100000 UNIT/GM EX CREA: 1.0000 | TOPICAL_CREAM | Freq: Two times a day (BID) | CUTANEOUS | 2 refills | Status: AC

## 2023-10-19 NOTE — Patient Instructions (Addendum)
 Please schedule an appointment for your FLU shot for 2025-2026 year!  Flu Clinic Days (Thursday's Preferred) September: 4, 18, 25 October: 2, 9, 16, 23, 30 November: 6, 13, 20 December: 4, 11, 18 Health Maintenance, Female Adopting a healthy lifestyle and getting preventive care are important in promoting health and wellness. Ask your health care provider about: The right schedule for you to have regular tests and exams. Things you can do on your own to prevent diseases and keep yourself healthy. What should I know about diet, weight, and exercise? Eat a healthy diet  Eat a diet that includes plenty of vegetables, fruits, low-fat dairy products, and lean protein. Do not eat a lot of foods that are high in solid fats, added sugars, or sodium. Maintain a healthy weight Body mass index (BMI) is used to identify weight problems. It estimates body fat based on height and weight. Your health care provider can help determine your BMI and help you achieve or maintain a healthy weight. Get regular exercise Get regular exercise. This is one of the most important things you can do for your health. Most adults should: Exercise for at least 150 minutes each week. The exercise should increase your heart rate and make you sweat (moderate-intensity exercise). Do strengthening exercises at least twice a week. This is in addition to the moderate-intensity exercise. Spend less time sitting. Even light physical activity can be beneficial. Watch cholesterol and blood lipids Have your blood tested for lipids and cholesterol at 62 years of age, then have this test every 5 years. Have your cholesterol levels checked more often if: Your lipid or cholesterol levels are high. You are older than 62 years of age. You are at high risk for heart disease. What should I know about cancer screening? Depending on your health history and family history, you may need to have cancer screening at various ages. This may  include screening for: Breast cancer. Cervical cancer. Colorectal cancer. Skin cancer. Lung cancer. What should I know about heart disease, diabetes, and high blood pressure? Blood pressure and heart disease High blood pressure causes heart disease and increases the risk of stroke. This is more likely to develop in people who have high blood pressure readings or are overweight. Have your blood pressure checked: Every 3-5 years if you are 33-52 years of age. Every year if you are 25 years old or older. Diabetes Have regular diabetes screenings. This checks your fasting blood sugar level. Have the screening done: Once every three years after age 65 if you are at a normal weight and have a low risk for diabetes. More often and at a younger age if you are overweight or have a high risk for diabetes. What should I know about preventing infection? Hepatitis B If you have a higher risk for hepatitis B, you should be screened for this virus. Talk with your health care provider to find out if you are at risk for hepatitis B infection. Hepatitis C Testing is recommended for: Everyone born from [birth year] through 1964. Anyone with known risk factors for hepatitis C. Sexually transmitted infections (STIs) Get screened for STIs, including gonorrhea and chlamydia, if: You are sexually active and are younger than 62 years of age. You are older than 62 years of age and your health care provider tells you that you are at risk for this type of infection. Your sexual activity has changed since you were last screened, and you are at increased risk for chlamydia or gonorrhea. Ask your  health care provider if you are at risk. Ask your health care provider about whether you are at high risk for HIV. Your health care provider may recommend a prescription medicine to help prevent HIV infection. If you choose to take medicine to prevent HIV, you should first get tested for HIV. You should then be tested every 3 months  for as long as you are taking the medicine. Pregnancy If you are about to stop having your period (premenopausal) and you may become pregnant, seek counseling before you get pregnant. Take 400 to 800 micrograms (mcg) of folic acid every day if you become pregnant. Ask for birth control (contraception) if you want to prevent pregnancy. Osteoporosis and menopause Osteoporosis is a disease in which the bones lose minerals and strength with aging. This can result in bone fractures. If you are 68 years old or older, or if you are at risk for osteoporosis and fractures, ask your health care provider if you should: Be screened for bone loss. Take a calcium or vitamin D  supplement to lower your risk of fractures. Be given hormone replacement therapy (HRT) to treat symptoms of menopause. Follow these instructions at home: Alcohol use Do not drink alcohol if: Your health care provider tells you not to drink. You are pregnant, may be pregnant, or are planning to become pregnant. If you drink alcohol: Limit how much you have to: 0-1 drink a day. Know how much alcohol is in your drink. In the [place] one drink equals one 12 oz bottle of beer (355 mL), one 5 oz glass of wine (148 mL), or one 1 oz glass of hard liquor (44 mL). Lifestyle Do not use any products that contain nicotine  or tobacco. These products include cigarettes, chewing tobacco, and vaping devices, such as e-cigarettes. If you need help quitting, ask your health care provider. Do not use street drugs. Do not share needles. Ask your health care provider for help if you need support or information about quitting drugs. General instructions Schedule regular health, dental, and eye exams. Stay current with your vaccines. Tell your health care provider if: You often feel depressed. You have ever been abused or do not feel safe at home. Summary Adopting a healthy lifestyle and getting preventive care are important in promoting health and  wellness. Follow your health care provider's instructions about healthy diet, exercising, and getting tested or screened for diseases. Follow your health care provider's instructions on monitoring your cholesterol and blood pressure. This information is not intended to replace advice given to you by your health care provider. Make sure you discuss any questions you have with your health care provider. Document Revised: 06/24/2020 Document Reviewed: 06/24/2020 Elsevier Patient Education  2024 [employer].

## 2023-10-19 NOTE — Progress Notes (Signed)
 Subjective:   Sherry Montgomery is a 63 y.o. female who presents for Medicare Annual (Subsequent) preventive examination.  Visit Complete: In person  Patient Medicare AWV questionnaire was completed by the patient on 10/19/2023; I have confirmed that all information answered by patient is correct and no changes since this date.  Cardiac Risk Factors include: advanced age (>29men, >15 women);hypertension;dyslipidemia     Objective:    Today's Vitals   10/19/23 1009 10/19/23 1047  BP: 100/70   Pulse: 67   Resp: 16   Temp: (!) 97 F (36.1 C)   SpO2: 97%   Weight: 202 lb (91.6 kg)   Height: 4' 11 (1.499 m)   PainSc:  7    Body mass index is 40.8 kg/m.     10/05/2022   11:07 AM 07/01/2021    2:12 PM  Advanced Directives  Does Patient Have a Medical Advance Directive? No No  Would patient like information on creating a medical advance directive? Yes (ED - Information included in AVS) No - Patient declined    Current Medications (verified) Outpatient Encounter Medications as of 10/19/2023  Medication Sig   pravastatin  (PRAVACHOL ) 40 MG tablet Take 1 tablet (40 mg total) by mouth daily.   No facility-administered encounter medications on file as of 10/19/2023.    Allergies (verified) Codeine, Prednisone, Shellfish allergy, Acetaminophen, Ampicillin, Cefdinir, Ciprofloxacin, Doxycycline, Erythromycin, Fish-derived products, Hydrocodone-acetaminophen, Ibuprofen, Silver nitrate, Sulfa antibiotics, Tramadol, Sulfacetamide, Tramadol hcl, Cephalexin, and Sulfamethoxazole   History: Past Medical History:  Diagnosis Date   Arthritis    Asthma    Diverticulitis    Emphysema, unspecified (HCC) 2024   Noted on CT imaging 2024   Heartburn    History of COVID-19    Kidney stones    Migraine    Prediabetes    Thoracic aortic atherosclerosis (HCC) 2024   Noted on CT imaging 2024   Tobacco abuse    Past Surgical History:  Procedure Laterality Date   APPENDECTOMY  1985   disc  fusion in neck  2022   GALLBLADDER SURGERY  1993   HERNIA REPAIR  1979   ROTATOR CUFF REPAIR  2016   TOTAL ABDOMINAL HYSTERECTOMY  1984   Family History  Problem Relation Age of Onset   Alzheimer's disease Mother    Lupus Father    Lung cancer Sister    Leukemia Sister    Alzheimer's disease Sister    Alzheimer's disease Brother    Breast cancer Neg Hx    Social History   Socioeconomic History   Marital status: Married    Spouse name: Not on file   Number of children: 2   Years of education: Not on file   Highest education level: 12th grade  Occupational History   Not on file  Tobacco Use   Smoking status: Every Day    Current packs/day: 0.50    Average packs/day: 1 pack/day for 51.7 years (51.2 ttl pk-yrs)    Types: Cigarettes    Start date: 1974   Smokeless tobacco: Never  Vaping Use   Vaping status: Every Day  Substance and Sexual Activity   Alcohol use: Never   Drug use: Never   Sexual activity: Yes    Partners: Male  Other Topics Concern   Not on file  Social History Narrative   Not on file   Social Drivers of Health   Financial Resource Strain: Low Risk  (10/19/2023)   Overall Financial Resource Strain (CARDIA)  Difficulty of Paying Living Expenses: Not hard at all  Food Insecurity: No Food Insecurity (10/19/2023)   Hunger Vital Sign    Worried About Running Out of Food in the Last Year: Never true    Ran Out of Food in the Last Year: Never true  Transportation Needs: No Transportation Needs (10/19/2023)   PRAPARE - Administrator, Civil Service (Medical): No    Lack of Transportation (Non-Medical): No  Physical Activity: Inactive (09/07/2023)   Exercise Vital Sign    Days of Exercise per Week: 0 days    Minutes of Exercise per Session: Not on file  Stress: No Stress Concern Present (10/19/2023)   [employer] of Occupational Health - Occupational Stress Questionnaire    Feeling of Stress: Not at all  Social Connections: Moderately  Integrated (10/19/2023)   Social Connection and Isolation Panel    Frequency of Communication with Friends and Family: More than three times a week    Frequency of Social Gatherings with Friends and Family: Twice a week    Attends Religious Services: More than 4 times per year    Active Member of [employer] or Organizations: No    Attends Engineer, structural: Never    Marital Status: Married    Tobacco Counseling Ready to quit: Not Answered Counseling given: Not Answered   Clinical Intake:  Pre-visit preparation completed: Yes  Pain : 0-10 Pain Score: 7  Pain Type: Chronic pain Pain Location: Back Pain Orientation: Lower Pain Radiating Towards: both legs Pain Descriptors / Indicators: Aching, Burning, Dull Pain Onset: More than a month ago Pain Frequency: Constant Pain Relieving Factors: pain relief Effect of Pain on Daily Activities: yes  Pain Relieving Factors: pain relief  BMI - recorded: 40.8 Nutritional Status: BMI > 30  Obese Nutritional Risks: None Diabetes: Yes CBG done?: No Did pt. bring in CBG monitor from home?: No  How often do you need to have someone help you when you read instructions, pamphlets, or other written materials from your doctor or pharmacy?: 1 - Never What is the last grade level you completed in school?: High School Diploma  Interpreter Needed?: No      Activities of Daily Living    10/19/2023   10:41 AM  In your present state of health, do you have any difficulty performing the following activities:  Hearing? 0  Vision? 1  Comment wears glasses  Difficulty concentrating or making decisions? 0  Walking or climbing stairs? 0  Dressing or bathing? 0  Doing errands, shopping? 0  Preparing Food and eating ? N  Using the Toilet? N  In the past six months, have you accidently leaked urine? N  Do you have problems with loss of bowel control? N  Managing your Medications? N  Managing your Finances? N  Housekeeping or managing your  Housekeeping? N    Patient Care Team: Nicholaus Credit, DEVONNA as PCP - General (Physician Assistant) Monetta Redell PARAS, MD as PCP - Cardiology (Cardiology)  Indicate any recent Medical Services you may have received from other than [facility] providers in the past year (date may be approximate).    PHYSICAL EXAM:   VS: BP 100/70   Pulse 67   Temp (!) 97 F (36.1 C)   Resp 16   Ht 4' 11 (1.499 m)   Wt 202 lb (91.6 kg)   LMP  (LMP Unknown)   SpO2 97%   BMI 40.80 kg/m   GEN: Well nourished, well developed, in  no acute distress   Cardiac: RRR; no murmurs, rubs, or gallops,no edema -  Respiratory:  normal respiratory rate and pattern with no distress - normal breath sounds with no rales, rhonchi, wheezes or rubs  Psych: euthymic mood, appropriate affect and demeanor  Assessment:   This is a routine wellness examination for Sherry Montgomery.  Hearing/Vision screen Vision Screening   Right eye Left eye Both eyes  Without correction     With correction 20/32 20/63 20/32      Goals Addressed             This Visit's Progress    Set My Weight Loss Goal       Follow Up Date 10/19/2023    - set weight loss goal 172 lb    Why is this important?   Losing only 5 to 15 percent of your weight makes a big difference in your health.    Notes: Cardiologist recommendation       Depression Screen    10/19/2023   10:41 AM 09/07/2023    9:57 AM 06/02/2023   10:34 AM 02/03/2023    8:27 AM 10/05/2022   11:04 AM 08/03/2022    9:25 AM 06/23/2022   10:48 AM  PHQ 2/9 Scores  PHQ - 2 Score 0 0 0 0 0 0 0  PHQ- 9 Score    2  2 3     Fall Risk    10/19/2023   10:40 AM 09/07/2023    9:47 AM 06/02/2023   10:34 AM 02/03/2023    8:26 AM 10/05/2022   11:06 AM  Fall Risk   Falls in the past year? 0 0 0 0 0  Number falls in past yr: 0 0 0 0 0  Injury with Fall? 0 0 0 0 0  Risk for fall due to : No Fall Risks No Fall Risks No Fall Risks No Fall Risks No Fall Risks  Follow up Education provided   Falls  evaluation completed Falls evaluation completed    MEDICARE RISK AT HOME: Medicare Risk at Home Any stairs in or around the home?: Yes If so, are there any without handrails?: No Adequate lighting in your home to reduce risk of falls?: Yes Life alert?: No Use of a cane, walker or w/c?: No Grab bars in the bathroom?: Yes Shower chair or bench in shower?: No Elevated toilet seat or a handicapped toilet?: No  TIMED UP AND GO:  Was the test performed?  Yes  Length of time to ambulate 10 feet: 10 sec Gait steady and fast without use of assistive device    Cognitive Function:        10/19/2023   10:44 AM 10/05/2022   11:11 AM 07/01/2021    2:13 PM  6CIT Screen  What Year? 0 points 0 points 0 points  What month? 0 points 0 points 0 points  What time? 0 points 0 points 0 points  Count back from 20 0 points 0 points 0 points  Months in reverse 0 points 0 points 0 points  Repeat phrase 0 points 0 points 0 points  Total Score 0 points 0 points 0 points    Immunizations Immunization History  Administered Date(s) Administered   Tdap 03/27/2022    TDAP status: Up to date  Flu Vaccine status: Declined, Education has been provided regarding the importance of this vaccine but patient still declined. Advised may receive this vaccine at local pharmacy or Health Dept. Aware to provide a copy of the  vaccination record if obtained from local pharmacy or Health Dept. Verbalized acceptance and understanding.  Pneumococcal vaccine status: Declined,  Education has been provided regarding the importance of this vaccine but patient still declined. Advised may receive this vaccine at local pharmacy or Health Dept. Aware to provide a copy of the vaccination record if obtained from local pharmacy or Health Dept. Verbalized acceptance and understanding.   Covid-19 vaccine status: Completed vaccines  Qualifies for Shingles Vaccine? Yes   Zostavax completed Yes   Shingrix Completed?: No.    Education  has been provided regarding the importance of this vaccine. Patient has been advised to call insurance company to determine out of pocket expense if they have not yet received this vaccine. Advised may also receive vaccine at local pharmacy or Health Dept. Verbalized acceptance and understanding.  Screening Tests Health Maintenance  Topic Date Due   Lung Cancer Screening  04/08/2023   FOOT EXAM  01/10/2024 (Originally 08/03/2023)   INFLUENZA VACCINE  05/16/2024 (Originally 09/17/2023)   Pneumococcal Vaccine: 50+ Years (1 of 2 - PCV) 10/18/2024 (Originally [birth year]1980)   OPHTHALMOLOGY EXAM  02/26/2024   HEMOGLOBIN A1C  03/09/2024   Diabetic kidney evaluation - eGFR measurement  09/06/2024   Diabetic kidney evaluation - Urine ACR  09/06/2024   MAMMOGRAM  09/13/2024   Medicare Annual Wellness (AWV)  10/18/2024   DEXA SCAN  09/08/2025   DTaP/Tdap/Td (2 - Td or Tdap) 03/27/2032   Colonoscopy  08/18/2032   Hepatitis B Vaccines 19-59 Average Risk  Aged Out   HPV VACCINES  Aged Out   Meningococcal B Vaccine  Aged Out   COVID-19 Vaccine  Discontinued   Zoster Vaccines- Shingrix  Discontinued    Health Maintenance  Health Maintenance Due  Topic Date Due   Lung Cancer Screening  04/08/2023    Colorectal cancer screening: Type of screening: Colonoscopy. Completed 08/19/2022. Repeat every 10 years  Mammogram status: Completed 09/14/2023. Repeat every year  Bone Density status: Completed 09/09/2023. Results reflect: Bone density results: OSTEOPENIA. Repeat every 2 years.  Lung Cancer Screening: (Low Dose CT Chest recommended if Age 15-80 years, 20 pack-year currently smoking OR have quit w/in 15years.) does qualify.   Lung Cancer Screening Referral: no  Additional Screening:   Vision Screening: Recommended annual ophthalmology exams for early detection of glaucoma and other disorders of the eye. Is the patient up to date with their annual eye exam?  Yes  Who is the provider or what is the  name of the office in which the patient attends annual eye exams?    Dental Screening: Recommended annual dental exams for proper oral hygiene  Diabetic Foot Exam: Diabetic Foot Exam: Completed 09/07/2023  Community Resource Referral / Chronic Care Management: CRR required this visit?  No   CCM required this visit?  No     Plan:     I have personally reviewed and noted the following in the patient's chart:   Medical and social history Use of alcohol, tobacco or illicit drugs  Current medications and supplements including opioid prescriptions. Patient is not currently taking opioid prescriptions. Functional ability and status Nutritional status Physical activity Advanced directives List of other physicians Hospitalizations, surgeries, and ER visits in previous 12 months Vitals Screenings to include cognitive, depression, and falls Referrals and appointments  In addition, I have reviewed and discussed with patient certain preventive protocols, quality metrics, and best practice recommendations. A written personalized care plan for preventive services as well as general preventive health recommendations were  provided to patient.     SARA R Emilina Smarr, PA-C   10/19/2023   After Visit Summary: (In Person-Printed) AVS printed and given to the patient

## 2023-11-03 DIAGNOSIS — H40003 Preglaucoma, unspecified, bilateral: Secondary | ICD-10-CM | POA: Diagnosis not present

## 2023-11-03 DIAGNOSIS — H25813 Combined forms of age-related cataract, bilateral: Secondary | ICD-10-CM | POA: Diagnosis not present

## 2023-11-03 DIAGNOSIS — H47233 Glaucomatous optic atrophy, bilateral: Secondary | ICD-10-CM | POA: Diagnosis not present

## 2023-11-03 DIAGNOSIS — H40013 Open angle with borderline findings, low risk, bilateral: Secondary | ICD-10-CM | POA: Diagnosis not present

## 2023-11-09 ENCOUNTER — Ambulatory Visit: Payer: Self-pay | Admitting: *Deleted

## 2023-11-09 NOTE — Telephone Encounter (Signed)
 Copied from CRM #[id number]. Topic: Clinical - Red Word Triage >> Nov 09, 2023 12:30 PM Debby BROCKS wrote: Red Word that prompted transfer to Nurse Triage: patient had an allergy attack and she's had a cough non stop. Negative for covid, Pain on ear blood on Que tip, progressive cough, feels like she has an infenction Reason for Disposition  Earache  (Exceptions: Brief ear pain of lasting less than 60 minutes, or earache occurring during air travel.)  Answer Assessment - Initial Assessment Questions 1. LOCATION: Which ear is involved?     I have blood in my left ear.   Last week we went to church and when I got home I had a sore throat.   I ate honey.   My head was so congested.   My cheek area was sore.   I took Claritin and the next day my head opened up.  I was blowing my nose a lot. L I had clear sinus congestion.   But this cough is not going away.   When I cough I can hear my lungs rattling and I'm coughing up yellow mucus.   I had blood tinged mucus yesterday morning I coughed up.   No fever. My covid test was negative.   I don't know if I have an infection or what.   I can hear wheezing and crackling.   It takes a lot to cough up anything.   No more blood from my ear.    My right ear pops when I blow my nose.   I always get ear infections with a cold.   Just my left ear is bothering me. 2. ONSET: When did the ear pain start?      Last week. 3. SEVERITY: How bad is the pain?  (Scale 1-10; mild, moderate or severe)     My ears hurt.    I'm having forehead pain and my left cheek has pressure from sinus pressure.    My left cheek was a little puffy.   My pressure in my left eye was very high.   I'm having trouble seeing out of my left eye.   My eyeball is sore.   I am going back in tomorrow for another pressure test in my eye.      4. URI SYMPTOMS: Do you have a runny nose or cough?     Sinus infection, blowing a lot of mucus out of my nose.   5. FEVER: Do you have a fever? If Yes, ask:  What is your temperature, how was it measured, and when did it start?     No 6. CAUSE: Have you been swimming recently?, How often do you use Q-TIPS?, Have you had any recent air travel or scuba diving?     Not asked 7. OTHER SYMPTOMS: Do you have any other symptoms? (e.g., decreased hearing, dizziness, headache, stiff neck, vomiting)     See above 8. PREGNANCY: Is there any chance you are pregnant? When was your last menstrual period?     N/A due to age  Protocols used: Earache-A-AH FYI Only or Action Required?: FYI only for provider.  Patient was last seen in primary care on 10/19/2023 by Nicholaus Credit, PA-C.  Called Nurse Triage reporting Otalgia.earache, coughing, sinus congestion  Symptoms began several weeks ago. For 2 weeks now Interventions attempted: OTC medications: Robitussin.  Symptoms are: gradually worsening.  Triage Disposition: See Physician Within 24 Hours  Patient/caregiver understands and will follow disposition?: Yes

## 2023-11-10 ENCOUNTER — Ambulatory Visit (HOSPITAL_BASED_OUTPATIENT_CLINIC_OR_DEPARTMENT_OTHER)
Admission: RE | Admit: 2023-11-10 | Discharge: 2023-11-10 | Disposition: A | Discharge status: Home / Self Care | Source: Ambulatory Visit | Attending: Physician Assistant | Admitting: Radiology

## 2023-11-10 ENCOUNTER — Ambulatory Visit: Payer: Self-pay | Admitting: Physician Assistant

## 2023-11-10 ENCOUNTER — Ambulatory Visit (INDEPENDENT_AMBULATORY_CARE_PROVIDER_SITE_OTHER): Admitting: Physician Assistant

## 2023-11-10 ENCOUNTER — Encounter: Payer: Self-pay | Admitting: Physician Assistant

## 2023-11-10 VITALS — BP 124/82 | HR 76 | Temp 97.5°F | Ht 59.0 in | Wt 204.2 lb

## 2023-11-10 DIAGNOSIS — H47233 Glaucomatous optic atrophy, bilateral: Secondary | ICD-10-CM | POA: Diagnosis not present

## 2023-11-10 DIAGNOSIS — R053 Chronic cough: Secondary | ICD-10-CM | POA: Diagnosis not present

## 2023-11-10 DIAGNOSIS — Z981 Arthrodesis status: Secondary | ICD-10-CM | POA: Diagnosis not present

## 2023-11-10 DIAGNOSIS — H40013 Open angle with borderline findings, low risk, bilateral: Secondary | ICD-10-CM | POA: Diagnosis not present

## 2023-11-10 DIAGNOSIS — J069 Acute upper respiratory infection, unspecified: Secondary | ICD-10-CM | POA: Insufficient documentation

## 2023-11-10 MED ORDER — CLINDAMYCIN HCL 300 MG PO CAPS: 300.0000 mg | ORAL_CAPSULE | Freq: Three times a day (TID) | ORAL | 0 refills | Status: DC

## 2023-11-10 MED ORDER — BENZONATATE 200 MG PO CAPS: 200.0000 mg | ORAL_CAPSULE | Freq: Two times a day (BID) | ORAL | 0 refills | Status: DC | PRN

## 2023-11-10 NOTE — Assessment & Plan Note (Addendum)
 Acute bronchitis and acute sinusitis with allergic rhinitis Symptoms suggest allergies progressing to bronchitis. Differential includes pneumonia. - Order chest x-ray to rule out pneumonia. - Prescribe clindamycin  for bronchitis. - Advise albuterol  inhaler use at night. - Recommend Tessalon  Perles for evening cough suppression. - Suggest probiotics or yogurt to mitigate clindamycin  side effects. - Continue Claritin for allergy symptoms. Orders:   DG Chest 2 View; Future   clindamycin  (CLEOCIN ) 300 MG capsule; Take 1 capsule (300 mg total) by mouth 3 (three) times daily.   benzonatate  (TESSALON ) 200 MG capsule; Take 1 capsule (200 mg total) by mouth 2 (two) times daily as needed for cough.

## 2023-11-10 NOTE — Progress Notes (Signed)
 Acute Office Visit  Subjective:    Patient ID: Sherry Montgomery, female    DOB: 09-Dec-1960, 63 y.o.   MRN: [id number]  Chief Complaint  Patient presents with   Nasal Congestion    HPI:  Discussed the use of AI scribe software for clinical note transcription with the patient, who gave verbal consent to proceed.  History of Present Illness Sherry Montgomery is a 63 year old female who presents with ten days of sinus symptoms and persistent cough.  She has been experiencing sinus symptoms for the past ten days, including a persistent cough that is most severe in the morning and at night. The cough causes significant discomfort, with pain in the back of her ribs due to its intensity. Initially, her symptoms began with a raw, burning sore throat and extreme fatigue. She has been unable to sleep adequately, often waking up between two and three in the morning. She also reports a change in taste, with many things tasting like 'sour lemons'.  Her cough is sometimes productive, with yellow sputum, and at other times non-productive. She experiences wheezing and loud crackling sounds when lying down at night. Nasal drainage is clear, and she has significant facial and cheek soreness, particularly on the left side, along with ear discomfort. Her left ear occasionally pops and causes pain, and she has noticed blood on a Q-tip when cleaning it.  Her past medical history includes a ruptured eardrum and previous ear infections, for which she had tubes placed. She recalls a previous episode where she felt dizzy and experienced vertigo after a procedure on her ear. She has been taking Claritin, which initially helped with her symptoms, and has used raw honey to alleviate her sore throat. She has also been using a neti pot to manage her sinus congestion. She has a history of using clindamycin  for sinus infections, which causes some gastrointestinal discomfort by the third or fourth day of use.  She spends a  lot of time outdoors with her grandson, who has severe allergies. She suspects her symptoms may have started as allergies.     Past Medical History:  Diagnosis Date   Arthritis    Asthma    Diverticulitis    Emphysema, unspecified (HCC) 2024   Noted on CT imaging 2024   Heartburn    History of COVID-19    Kidney stones    Migraine    Prediabetes    Thoracic aortic atherosclerosis 2024   Noted on CT imaging 2024   Tobacco abuse     Past Surgical History:  Procedure Laterality Date   APPENDECTOMY  1985   disc fusion in neck  2022   GALLBLADDER SURGERY  1993   HERNIA REPAIR  1979   ROTATOR CUFF REPAIR  2016   TOTAL ABDOMINAL HYSTERECTOMY  1984    Family History  Problem Relation Age of Onset   Alzheimer's disease Mother    Lupus Father    Lung cancer Sister    Leukemia Sister    Alzheimer's disease Sister    Alzheimer's disease Brother    Breast cancer Neg Hx     Social History   Socioeconomic History   Marital status: Married    Spouse name: Not on file   Number of children: 2   Years of education: Not on file   Highest education level: 12th grade  Occupational History   Not on file  Tobacco Use   Smoking status: Every Day    Current  packs/day: 0.50    Average packs/day: 1 pack/day for 51.7 years (51.2 ttl pk-yrs)    Types: Cigarettes    Start date: 74   Smokeless tobacco: Never  Vaping Use   Vaping status: Every Day  Substance and Sexual Activity   Alcohol use: Never   Drug use: Never   Sexual activity: Yes    Partners: Male  Other Topics Concern   Not on file  Social History Narrative   Not on file   Social Drivers of Health   Financial Resource Strain: Low Risk  (10/19/2023)   Overall Financial Resource Strain (CARDIA)    Difficulty of Paying Living Expenses: Not hard at all  Food Insecurity: No Food Insecurity (10/19/2023)   Hunger Vital Sign    Worried About Running Out of Food in the Last Year: Never true    Ran Out of Food in the Last  Year: Never true  Transportation Needs: No Transportation Needs (10/19/2023)   PRAPARE - Administrator, Civil Service (Medical): No    Lack of Transportation (Non-Medical): No  Physical Activity: Inactive (09/07/2023)   Exercise Vital Sign    Days of Exercise per Week: 0 days    Minutes of Exercise per Session: Not on file  Stress: No Stress Concern Present (10/19/2023)   [employer] of Occupational Health - Occupational Stress Questionnaire    Feeling of Stress: Not at all  Social Connections: Moderately Integrated (10/19/2023)   Social Connection and Isolation Panel    Frequency of Communication with Friends and Family: More than three times a week    Frequency of Social Gatherings with Friends and Family: Twice a week    Attends Religious Services: More than 4 times per year    Active Member of [employer] or Organizations: No    Attends Banker Meetings: Never    Marital Status: Married  Catering manager Violence: Not At Risk (10/19/2023)   Humiliation, Afraid, Rape, and Kick questionnaire    Fear of Current or Ex-Partner: No    Emotionally Abused: No    Physically Abused: No    Sexually Abused: No    Outpatient Medications Prior to Visit  Medication Sig Dispense Refill   nystatin  cream (MYCOSTATIN ) Apply 1 Application topically 2 (two) times daily. 30 g 2   pravastatin  (PRAVACHOL ) 40 MG tablet Take 1 tablet (40 mg total) by mouth daily. 90 tablet 3   No facility-administered medications prior to visit.    Allergies  Allergen Reactions   Codeine Anaphylaxis and Other (See Comments)    Throat swelling  Throat swelling, Throat swelling    Throat swelling Throat swelling Throat swelling    Throat swelling Throat swelling    Throat swelling   Prednisone Hypertension and Other (See Comments)   Shellfish Allergy Anaphylaxis    Other reaction(s): Shock (ALLERGY)  Other reaction(s): Shock (ALLERGY) Other reaction(s): Shock (ALLERGY)   Acetaminophen  Rash and Dermatitis   Ampicillin Rash and Dermatitis   Cefdinir Rash and Dermatitis   Ciprofloxacin Nausea And Vomiting and Other (See Comments)   Doxycycline Rash and Dermatitis   Erythromycin Other (See Comments)   Fish-Derived Products Rash and Dermatitis   Hydrocodone-Acetaminophen Nausea And Vomiting, Rash, Dermatitis and Other (See Comments)    ask  ask, ask    ask ask ask    ask ask    ask   Ibuprofen Other (See Comments)    Other reaction(s): Sweating (intolerance)  Reaction: Sweating (intolerance); Other reaction(s): Sweating (  intolerance)     Other reaction(s): Sweating (intolerance) Other reaction(s): Sweating (intolerance)    Other reaction(s): Sweating (intolerance)   Silver Nitrate Swelling   Sulfa Antibiotics Dermatitis   Tramadol Other (See Comments)    ask  ask, ask    ask ask ask    ask ask    ask   Sulfacetamide    Tramadol Hcl    Cephalexin Rash   Sulfamethoxazole Rash    Review of Systems  Constitutional:  Negative for chills, fatigue and fever.  HENT:  Positive for congestion, ear pain, sinus pressure and sinus pain.   Respiratory:  Positive for cough. Negative for shortness of breath.   Cardiovascular:  Negative for chest pain.  Gastrointestinal:  Negative for abdominal pain, constipation, diarrhea, nausea and vomiting.  Musculoskeletal:  Negative for myalgias.  Neurological:  Negative for headaches.       Objective:        11/10/2023    8:41 AM 10/19/2023   10:09 AM 09/07/2023    9:57 AM  Vitals with BMI  Height 4' 11 4' 11 4' 11  Weight 204 lbs 3 oz 202 lbs 202 lbs  BMI 41.22 40.78 40.78  Systolic 124 100 871  Diastolic 82 70 82  Pulse 76 67 66    No data found.   Physical Exam Vitals reviewed.  Constitutional:      Appearance: Normal appearance.  Neck:     Vascular: No carotid bruit.  Cardiovascular:     Rate and Rhythm: Normal rate and regular rhythm.     Heart sounds: Normal heart sounds.  Pulmonary:      Effort: Pulmonary effort is normal.     Breath sounds: Wheezing and rales present.  Abdominal:     General: Bowel sounds are normal.     Palpations: Abdomen is soft.     Tenderness: There is no abdominal tenderness.  Neurological:     Mental Status: She is alert and oriented to person, place, and time.  Psychiatric:        Mood and Affect: Mood normal.        Behavior: Behavior normal.     There are no preventive care reminders to display for this patient.  There are no preventive care reminders to display for this patient.   Lab Results  Component Value Date   TSH 1.990 09/07/2023   Lab Results  Component Value Date   WBC 8.0 09/07/2023   HGB 14.8 09/07/2023   HCT 44.1 09/07/2023   MCV 95 09/07/2023   PLT 208 09/07/2023   Lab Results  Component Value Date   NA 138 09/07/2023   K 4.5 09/07/2023   CO2 19 (L) 09/07/2023   GLUCOSE 110 (H) 09/07/2023   BUN 9 09/07/2023   CREATININE 0.87 09/07/2023   BILITOT 0.4 09/07/2023   ALKPHOS 68 09/07/2023   AST 24 09/07/2023   ALT 19 09/07/2023   PROT 7.3 09/07/2023   ALBUMIN 3.9 09/07/2023   CALCIUM 9.2 09/07/2023   EGFR 75 09/07/2023   Lab Results  Component Value Date   CHOL 148 09/07/2023   Lab Results  Component Value Date   HDL 37 (L) 09/07/2023   Lab Results  Component Value Date   LDLCALC 87 09/07/2023   Lab Results  Component Value Date   TRIG 137 09/07/2023   Lab Results  Component Value Date   CHOLHDL 4.0 09/07/2023   Lab Results  Component Value Date   HGBA1C 6.5 (H)  09/07/2023        Results for orders placed or performed in visit on 09/07/23  Rheumatoid factor   Collection Time: 09/07/23 10:20 AM  Result Value Ref Range   Rheumatoid fact SerPl-aCnc <10.0 <14.0 IU/mL  CYCLIC CITRUL PEPTIDE ANTIBODY, IGG/IGA   Collection Time: 09/07/23 10:20 AM  Result Value Ref Range   Cyclic Citrullin Peptide Ab 15 0 - 19 units  Sedimentation rate   Collection Time: 09/07/23 10:20 AM  Result Value  Ref Range   Sed Rate 38 0 - 40 mm/hr  C-reactive protein   Collection Time: 09/07/23 10:20 AM  Result Value Ref Range   CRP 8 0 - 10 mg/L  ANA w/Reflex   Collection Time: 09/07/23 10:20 AM  Result Value Ref Range   Anti Nuclear Antibody (ANA) Negative Negative  Uric acid   Collection Time: 09/07/23 10:20 AM  Result Value Ref Range   Uric Acid 5.8 3.0 - 7.2 mg/dL  Parvovirus A80 antibody, IgG and IgM   Collection Time: 09/07/23 10:20 AM  Result Value Ref Range   Parvovirus B19 IgG 6.4 (H) 0.0 - 0.8 index   Parvovirus B19 IgM 0.1 0.0 - 0.8 index  CBC with Differential/Platelet   Collection Time: 09/07/23 10:26 AM  Result Value Ref Range   WBC 8.0 3.4 - 10.8 x10E3/uL   RBC 4.66 3.77 - 5.28 x10E6/uL   Hemoglobin 14.8 11.1 - 15.9 g/dL   Hematocrit 55.8 65.9 - 46.6 %   MCV 95 79 - 97 fL   MCH 31.8 26.6 - 33.0 pg   MCHC 33.6 31.5 - 35.7 g/dL   RDW 88.1 88.2 - 84.5 %   Platelets 208 150 - 450 x10E3/uL   Neutrophils 58 Not Estab. %   Lymphs 29 Not Estab. %   Monocytes 9 Not Estab. %   Eos 3 Not Estab. %   Basos 1 Not Estab. %   Neutrophils Absolute 4.6 1.4 - 7.0 x10E3/uL   Lymphocytes Absolute 2.4 0.7 - 3.1 x10E3/uL   Monocytes Absolute 0.8 0.1 - 0.9 x10E3/uL   EOS (ABSOLUTE) 0.3 0.0 - 0.4 x10E3/uL   Basophils Absolute 0.1 0.0 - 0.2 x10E3/uL   Immature Granulocytes 0 Not Estab. %   Immature Grans (Abs) 0.0 0.0 - 0.1 x10E3/uL  Comprehensive metabolic panel with GFR   Collection Time: 09/07/23 10:26 AM  Result Value Ref Range   Glucose 110 (H) 70 - 99 mg/dL   BUN 9 8 - 27 mg/dL   Creatinine, Ser 9.12 0.57 - 1.00 mg/dL   eGFR 75 >40 fO/fpw/8.26   BUN/Creatinine Ratio 10 (L) 12 - 28   Sodium 138 134 - 144 mmol/L   Potassium 4.5 3.5 - 5.2 mmol/L   Chloride 104 96 - 106 mmol/L   CO2 19 (L) 20 - 29 mmol/L   Calcium 9.2 8.7 - 10.3 mg/dL   Total Protein 7.3 6.0 - 8.5 g/dL   Albumin 3.9 3.9 - 4.9 g/dL   Globulin, Total 3.4 1.5 - 4.5 g/dL   Bilirubin Total 0.4 0.0 - 1.2 mg/dL    Alkaline Phosphatase 68 44 - 121 IU/L   AST 24 0 - 40 IU/L   ALT 19 0 - 32 IU/L  TSH   Collection Time: 09/07/23 10:26 AM  Result Value Ref Range   TSH 1.990 0.450 - 4.500 uIU/mL  Lipid panel   Collection Time: 09/07/23 10:26 AM  Result Value Ref Range   Cholesterol, Total 148 100 - 199 mg/dL  Triglycerides 137 0 - 149 mg/dL   HDL 37 (L) >60 mg/dL   VLDL Cholesterol Cal 24 5 - 40 mg/dL   LDL Chol Calc (NIH) 87 0 - 99 mg/dL   Chol/HDL Ratio 4.0 0.0 - 4.4 ratio  Hemoglobin A1c   Collection Time: 09/07/23 10:26 AM  Result Value Ref Range   Hgb A1c MFr Bld 6.5 (H) 4.8 - 5.6 %   Est. average glucose Bld gHb Est-mCnc 140 mg/dL  VITAMIN D  25 Hydroxy (Vit-D Deficiency, Fractures)   Collection Time: 09/07/23 10:26 AM  Result Value Ref Range   Vit D, 25-Hydroxy 27.4 (L) 30.0 - 100.0 ng/mL  Microalbumin/Creatinine Ratio, Urine   Collection Time: 09/07/23 10:29 AM  Result Value Ref Range   Creatinine, Urine 137.8 Not Estab. mg/dL   Microalbumin, Urine 3.9 Not Estab. ug/mL   Microalb/Creat Ratio 3 0 - 29 mg/g creat    Total time spent on today's visit was 20 minutes, including both face-to-face time and nonface-to-face time personally spent on review of chart (labs and imaging), discussing labs and goals, discussing further work-up, treatment options, referrals to specialist if needed, reviewing outside records of pertinent, answering patient's questions, and coordinating care.  Assessment & Plan:   Assessment & Plan Upper respiratory tract infection, unspecified type Acute bronchitis and acute sinusitis with allergic rhinitis Symptoms suggest allergies progressing to bronchitis. Differential includes pneumonia. - Order chest x-ray to rule out pneumonia. - Prescribe clindamycin  for bronchitis. - Advise albuterol  inhaler use at night. - Recommend Tessalon  Perles for evening cough suppression. - Suggest probiotics or yogurt to mitigate clindamycin  side effects. - Continue Claritin  for allergy symptoms. Orders:   DG Chest 2 View; Future   clindamycin  (CLEOCIN ) 300 MG capsule; Take 1 capsule (300 mg total) by mouth 3 (three) times daily.   benzonatate  (TESSALON ) 200 MG capsule; Take 1 capsule (200 mg total) by mouth 2 (two) times daily as needed for cough.     Body mass index is 41.24 kg/m..    Meds ordered this encounter  Medications   clindamycin  (CLEOCIN ) 300 MG capsule    Sig: Take 1 capsule (300 mg total) by mouth 3 (three) times daily.    Dispense:  21 capsule    Refill:  0   benzonatate  (TESSALON ) 200 MG capsule    Sig: Take 1 capsule (200 mg total) by mouth 2 (two) times daily as needed for cough.    Dispense:  30 capsule    Refill:  0    Orders Placed This Encounter  Procedures   DG Chest 2 View     Follow-up: Return if symptoms worsen or fail to improve.  An After Visit Summary was printed and given to the patient.  Nola Angles, [place] [facility] [phone]

## 2023-12-08 ENCOUNTER — Telehealth: Payer: Self-pay

## 2023-12-08 NOTE — Telephone Encounter (Signed)
 Copied from CRM [id number]. Topic: Clinical - Medical Advice >> Dec 08, 2023  4:30 PM Lonell PEDLAR wrote: Reason for CRM: Patient is unable to sleep at night, starting to effect her day to day life. Patient is requesting something to help her sleep. Please review and advise.

## 2023-12-08 NOTE — Telephone Encounter (Signed)
 Recommend to try otc melatonin or could try tylenol pm If she has tried and those not helping recommend to come in for office visit to discuss

## 2023-12-09 ENCOUNTER — Ambulatory Visit (INDEPENDENT_AMBULATORY_CARE_PROVIDER_SITE_OTHER): Admitting: Physician Assistant

## 2023-12-09 ENCOUNTER — Encounter: Payer: Self-pay | Admitting: Physician Assistant

## 2023-12-09 VITALS — BP 120/70 | HR 80 | Temp 97.8°F | Resp 18 | Ht 59.0 in | Wt 203.2 lb

## 2023-12-09 DIAGNOSIS — F5101 Primary insomnia: Secondary | ICD-10-CM | POA: Diagnosis not present

## 2023-12-09 MED ORDER — TRAZODONE HCL 50 MG PO TABS: 50.0000 mg | ORAL_TABLET | Freq: Every day | ORAL | 2 refills | Status: DC

## 2023-12-09 NOTE — Progress Notes (Signed)
 Subjective:  Patient ID: Sherry Montgomery, female    DOB: 1960-06-16  Age: 63 y.o. MRN: [id number]  Chief Complaint  Patient presents with   Insomnia    HPI Pt in today with complaints of insomnia. She states she has not been sleeping well. She can fall asleep fine but then only sleeps about 2 hours then is up through the night - she has tried otc melatonin and benadryl in the past to help sleep .  She cannot take tylenol PM She has been tested for sleep apnea in the past and was negative      11/10/2023    8:45 AM 10/19/2023   10:41 AM 09/07/2023    9:57 AM 06/02/2023   10:34 AM 02/03/2023    8:27 AM  Depression screen PHQ 2/9  Decreased Interest 0 0 0 0 0  Down, Depressed, Hopeless 0 0 0 0 0  PHQ - 2 Score 0 0 0 0 0  Altered sleeping     1  Tired, decreased energy     1  Change in appetite     0  Feeling bad or failure about yourself      0  Trouble concentrating     0  Moving slowly or fidgety/restless     0  Suicidal thoughts     0  PHQ-9 Score     2  Difficult doing work/chores     Not difficult at all        02/03/2023    8:26 AM 06/02/2023   10:34 AM 09/07/2023    9:47 AM 10/19/2023   10:40 AM 11/10/2023    8:44 AM  Fall Risk  Falls in the past year? 0 0 0 0 1  Was there an injury with Fall? 0 0 0 0 0  Fall Risk Category Calculator 0 0 0 0 1  Patient at Risk for Falls Due to No Fall Risks No Fall Risks No Fall Risks No Fall Risks History of fall(s)  Fall risk Follow up Falls evaluation completed   Education provided Falls evaluation completed     ROS CONSTITUTIONAL: Negative for chills, fatigue, fever,  CARDIOVASCULAR: Negative for chest pain,  RESPIRATORY: Negative for recent cough and dyspnea.   PSYCHIATRIC:see HPI   Current Outpatient Medications:    benzonatate  (TESSALON ) 200 MG capsule, Take 1 capsule (200 mg total) by mouth 2 (two) times daily as needed for cough., Disp: 30 capsule, Rfl: 0   clindamycin  (CLEOCIN ) 300 MG capsule, Take 1 capsule (300 mg  total) by mouth 3 (three) times daily., Disp: 21 capsule, Rfl: 0   nystatin  cream (MYCOSTATIN ), Apply 1 Application topically 2 (two) times daily., Disp: 30 g, Rfl: 2   pravastatin  (PRAVACHOL ) 40 MG tablet, Take 1 tablet (40 mg total) by mouth daily., Disp: 90 tablet, Rfl: 3   traZODone (DESYREL) 50 MG tablet, Take 1 tablet (50 mg total) by mouth at bedtime., Disp: 30 tablet, Rfl: 2  Past Medical History:  Diagnosis Date   Arthritis    Asthma    Diverticulitis    Emphysema, unspecified (HCC) 2024   Noted on CT imaging 2024   Heartburn    History of COVID-19    Kidney stones    Migraine    Prediabetes    Thoracic aortic atherosclerosis 2024   Noted on CT imaging 2024   Tobacco abuse    Objective:  PHYSICAL EXAM:   VS: BP 120/70   Pulse 80   Temp  97.8 F (36.6 C) (Temporal)   Resp 18   Ht 4' 11 (1.499 m)   Wt 203 lb 3.2 oz (92.2 kg)   LMP  (LMP Unknown)   SpO2 99%   BMI 41.04 kg/m   GEN: Well nourished, well developed, in no acute distress   Cardiac: RRR; no murmurs, Respiratory:  normal respiratory rate and pattern with no distress - normal breath sounds with no rales, rhonchi, wheezes or rubs  Psych: euthymic mood, appropriate affect and demeanor   Assessment & Plan:    Primary insomnia -     traZODone HCl; Take 1 tablet (50 mg total) by mouth at bedtime.  Dispense: 30 tablet; Refill: 2     Follow-up: Return for at next chronic visit.  An After Visit Summary was printed and given to the patient.  CAMIE JONELLE NICHOLAUS DEVONNA [facility] [phone]

## 2023-12-09 NOTE — Telephone Encounter (Signed)
 Called patient appointment made for 11:20

## 2023-12-28 DIAGNOSIS — Z1330 Encounter for screening examination for mental health and behavioral disorders, unspecified: Secondary | ICD-10-CM | POA: Diagnosis not present

## 2023-12-28 DIAGNOSIS — M48062 Spinal stenosis, lumbar region with neurogenic claudication: Secondary | ICD-10-CM | POA: Diagnosis not present

## 2023-12-28 DIAGNOSIS — M545 Low back pain, unspecified: Secondary | ICD-10-CM | POA: Diagnosis not present

## 2023-12-28 DIAGNOSIS — M47816 Spondylosis without myelopathy or radiculopathy, lumbar region: Secondary | ICD-10-CM | POA: Diagnosis not present

## 2024-01-04 DIAGNOSIS — M48062 Spinal stenosis, lumbar region with neurogenic claudication: Secondary | ICD-10-CM | POA: Diagnosis not present

## 2024-01-04 DIAGNOSIS — M47816 Spondylosis without myelopathy or radiculopathy, lumbar region: Secondary | ICD-10-CM | POA: Diagnosis not present

## 2024-01-04 DIAGNOSIS — M47817 Spondylosis without myelopathy or radiculopathy, lumbosacral region: Secondary | ICD-10-CM | POA: Diagnosis not present

## 2024-01-04 DIAGNOSIS — M48061 Spinal stenosis, lumbar region without neurogenic claudication: Secondary | ICD-10-CM | POA: Diagnosis not present

## 2024-01-20 ENCOUNTER — Ambulatory Visit (INDEPENDENT_AMBULATORY_CARE_PROVIDER_SITE_OTHER)

## 2024-01-20 DIAGNOSIS — Z23 Encounter for immunization: Secondary | ICD-10-CM

## 2024-01-20 NOTE — Progress Notes (Signed)
 Patient came in today for nurse visit for Flu Vaccine. Patient was given FLUBLOK in the Left deltoid. Patient tolerated vaccine well

## 2024-01-25 DIAGNOSIS — M4316 Spondylolisthesis, lumbar region: Secondary | ICD-10-CM | POA: Diagnosis not present

## 2024-01-25 DIAGNOSIS — M5116 Intervertebral disc disorders with radiculopathy, lumbar region: Secondary | ICD-10-CM | POA: Insufficient documentation

## 2024-01-25 DIAGNOSIS — M48062 Spinal stenosis, lumbar region with neurogenic claudication: Secondary | ICD-10-CM | POA: Diagnosis not present

## 2024-01-25 DIAGNOSIS — M5431 Sciatica, right side: Secondary | ICD-10-CM | POA: Diagnosis not present

## 2024-01-25 DIAGNOSIS — M5432 Sciatica, left side: Secondary | ICD-10-CM | POA: Diagnosis not present

## 2024-01-27 ENCOUNTER — Encounter: Payer: Self-pay | Admitting: Family Medicine

## 2024-01-27 ENCOUNTER — Ambulatory Visit (INDEPENDENT_AMBULATORY_CARE_PROVIDER_SITE_OTHER): Admitting: Family Medicine

## 2024-01-27 ENCOUNTER — Ambulatory Visit: Payer: Self-pay

## 2024-01-27 VITALS — BP 128/78 | HR 90 | Temp 98.2°F | Ht 59.0 in | Wt 203.0 lb

## 2024-01-27 DIAGNOSIS — J320 Chronic maxillary sinusitis: Secondary | ICD-10-CM | POA: Insufficient documentation

## 2024-01-27 MED ORDER — CLINDAMYCIN HCL 300 MG PO CAPS: 300.0000 mg | ORAL_CAPSULE | Freq: Three times a day (TID) | ORAL | 0 refills | Status: DC

## 2024-01-27 NOTE — Assessment & Plan Note (Signed)
 Symptoms indicate bacterial sinusitis. Clindamycin  selected due to antibiotic allergies. - Prescribed clindamycin  TID. - Advised neti pot for nasal irrigation. - Recommended Claritin for allergies.

## 2024-01-27 NOTE — Progress Notes (Signed)
 Acute Office Visit  Subjective:    Patient ID: Sherry Montgomery, female    DOB: 04-03-1960, 63 y.o.   MRN: [id number]  Chief Complaint  Patient presents with   Sinusitis    Right sided facial swelling, hard knot, right ear pain x 2-3 days. Scheduled for back surgery 02/28/2024    Discussed the use of AI scribe software for clinical note transcription with the patient, who gave verbal consent to proceed.  History of Present Illness Sherry Montgomery is a 63 year old female with chronic sinus issues who presents with facial swelling and sinus pain.  Facial swelling and sinus pain - Facial swelling described as a 'hard knot' that has softened over time - Swelling significant enough to leave a dent from her glasses on her nose - Swelling primarily located between gum and cheek - Associated soreness and sensation of 'electrical shocks' in the affected area  Nasal congestion and discharge - Stuffy nose present for the last three days - Crusty blood noted upon blowing nose - Green mucus discharge without blood after using neti pot and Vicks vapor rub  Otologic symptoms - Sore right ear with sensation of fullness and 'weird' feeling - No history of ear infections since ear tubes fell out  Systemic symptoms - No fever or sore throat - Feels unusually cold - Has not taken her temperature  Sinus disease history and management - Chronic sinus issues - History of severe sinus infection requiring drainage - Uses neti pot and Vicks vapor rub for symptom management    Past Medical History:  Diagnosis Date   Arthritis    Asthma    Diverticulitis    Emphysema, unspecified (HCC) 2024   Noted on CT imaging 2024   Heartburn    History of COVID-19    Kidney stones    Migraine    Prediabetes    Thoracic aortic atherosclerosis 2024   Noted on CT imaging 2024   Tobacco abuse     Past Surgical History:  Procedure Laterality Date   APPENDECTOMY  1985   disc fusion in neck   2022   GALLBLADDER SURGERY  1993   HERNIA REPAIR  1979   ROTATOR CUFF REPAIR  2016   TOTAL ABDOMINAL HYSTERECTOMY  1984    Family History  Problem Relation Age of Onset   Alzheimer's disease Mother    Lupus Father    Lung cancer Sister    Leukemia Sister    Alzheimer's disease Sister    Alzheimer's disease Brother    Breast cancer Neg Hx     Social History   Socioeconomic History   Marital status: Married    Spouse name: Not on file   Number of children: 2   Years of education: Not on file   Highest education level: 12th grade  Occupational History   Not on file  Tobacco Use   Smoking status: Every Day    Current packs/day: 0.50    Average packs/day: 1 pack/day for 51.9 years (51.3 ttl pk-yrs)    Types: Cigarettes    Start date: 75   Smokeless tobacco: Never  Vaping Use   Vaping status: Every Day  Substance and Sexual Activity   Alcohol use: Never   Drug use: Never   Sexual activity: Yes    Partners: Male  Other Topics Concern   Not on file  Social History Narrative   Not on file   Social Drivers of Health   Tobacco Use:  High Risk (01/27/2024)   Patient History    Smoking Tobacco Use: Every Day    Smokeless Tobacco Use: Never    Passive Exposure: Not on file  Financial Resource Strain: Low Risk (12/28/2023)   Received from [facility]   Overall Financial Resource Strain (CARDIA)    How hard is it for you to pay for the very basics like food, housing, medical care, and heating?: Not hard at all  Food Insecurity: No Food Insecurity (12/28/2023)   Received from [facility]   Epic    Within the past 12 months, you worried that your food would run out before you got the money to buy more.: Never true    Within the past 12 months, the food you bought just didn't last and you didn't have money to get more.: Never true  Transportation Needs: No Transportation Needs (12/28/2023)   Received from [facility]    In the past 12 months, has lack of  transportation kept you from medical appointments or from getting medications?: No    In the past 12 months, has lack of transportation kept you from meetings, work, or from getting things needed for daily living?: No  Physical Activity: Inactive (09/07/2023)   Exercise Vital Sign    Days of Exercise per Week: 0 days    Minutes of Exercise per Session: Not on file  Stress: No Stress Concern Present (10/19/2023)   [employer] of Occupational Health - Occupational Stress Questionnaire    Feeling of Stress: Not at all  Social Connections: Moderately Integrated (10/19/2023)   Social Connection and Isolation Panel    Frequency of Communication with Friends and Family: More than three times a week    Frequency of Social Gatherings with Friends and Family: Twice a week    Attends Religious Services: More than 4 times per year    Active Member of [employer] or Organizations: No    Attends Banker Meetings: Never    Marital Status: Married  Catering Manager Violence: Not At Risk (10/19/2023)   Epic    Fear of Current or Ex-Partner: No    Emotionally Abused: No    Physically Abused: No    Sexually Abused: No  Depression (PHQ2-9): Low Risk (11/10/2023)   Depression (PHQ2-9)    PHQ-2 Score: 0  Alcohol Screen: Low Risk (10/19/2023)   Alcohol Screen    Last Alcohol Screening Score (AUDIT): 0  Housing: Low Risk (12/28/2023)   Received from [facility]    In the last 12 months, was there a time when you were not able to pay the mortgage or rent on time?: No    In the past 12 months, how many times have you moved where you were living?: 0    At any time in the past 12 months, were you homeless or living in a shelter (including now)?: No  Utilities: Not At Risk (12/28/2023)   Received from [facility]    In the past 12 months has the electric, gas, oil, or water company threatened to shut off services in your home?: No  Health Literacy: Adequate Health Literacy (10/19/2023)    B1300 Health Literacy    Frequency of need for help with medical instructions: Never    Outpatient Medications Prior to Visit  Medication Sig Dispense Refill   traZODone  (DESYREL ) 50 MG tablet Take 1 tablet (50 mg total) by mouth at bedtime. (Patient taking differently: Take 50  mg by mouth at bedtime as needed.) 30 tablet 2   nystatin  cream (MYCOSTATIN ) Apply 1 Application topically 2 (two) times daily. 30 g 2   pravastatin  (PRAVACHOL ) 40 MG tablet Take 1 tablet (40 mg total) by mouth daily. 90 tablet 3   benzonatate  (TESSALON ) 200 MG capsule Take 1 capsule (200 mg total) by mouth 2 (two) times daily as needed for cough. 30 capsule 0   clindamycin  (CLEOCIN ) 300 MG capsule Take 1 capsule (300 mg total) by mouth 3 (three) times daily. 21 capsule 0   No facility-administered medications prior to visit.    Allergies[1]  Review of Systems     Objective:        01/27/2024    1:26 PM 12/09/2023   11:29 AM 11/10/2023    8:41 AM  Vitals with BMI  Height 4' 11 4' 11 4' 11  Weight 203 lbs 203 lbs 3 oz 204 lbs 3 oz  BMI 40.98 41.02 41.22  Systolic 128 120 875  Diastolic 78 70 82  Pulse 90 80 76    No data found.   Physical Exam Vitals reviewed.  Constitutional:      Appearance: Normal appearance. She is obese.  HENT:     Right Ear: Tympanic membrane, ear canal and external ear normal.     Left Ear: Tympanic membrane, ear canal and external ear normal.     Nose: Congestion present.     Right Turbinates: Enlarged and swollen.     Left Turbinates: Enlarged and swollen.     Right Sinus: Maxillary sinus tenderness present.     Comments: MAXILLARY SINUS SWELLING.     Mouth/Throat:     Pharynx: Oropharynx is clear.  Cardiovascular:     Rate and Rhythm: Normal rate and regular rhythm.     Heart sounds: Normal heart sounds. No murmur heard. Pulmonary:     Effort: Pulmonary effort is normal. No respiratory distress.     Breath sounds: Normal breath sounds.  Lymphadenopathy:      Cervical: No cervical adenopathy.  Neurological:     Mental Status: She is alert and oriented to person, place, and time.  Psychiatric:        Mood and Affect: Mood normal.        Behavior: Behavior normal.     Health Maintenance Due  Topic Date Due   FOOT EXAM  08/03/2023    There are no preventive care reminders to display for this patient.   Lab Results  Component Value Date   TSH 1.990 09/07/2023   Lab Results  Component Value Date   WBC 8.0 09/07/2023   HGB 14.8 09/07/2023   HCT 44.1 09/07/2023   MCV 95 09/07/2023   PLT 208 09/07/2023   Lab Results  Component Value Date   NA 138 09/07/2023   K 4.5 09/07/2023   CO2 19 (L) 09/07/2023   GLUCOSE 110 (H) 09/07/2023   BUN 9 09/07/2023   CREATININE 0.87 09/07/2023   BILITOT 0.4 09/07/2023   ALKPHOS 68 09/07/2023   AST 24 09/07/2023   ALT 19 09/07/2023   PROT 7.3 09/07/2023   ALBUMIN 3.9 09/07/2023   CALCIUM 9.2 09/07/2023   EGFR 75 09/07/2023   Lab Results  Component Value Date   CHOL 148 09/07/2023   Lab Results  Component Value Date   HDL 37 (L) 09/07/2023   Lab Results  Component Value Date   LDLCALC 87 09/07/2023   Lab Results  Component Value Date  TRIG 137 09/07/2023   Lab Results  Component Value Date   CHOLHDL 4.0 09/07/2023   Lab Results  Component Value Date   HGBA1C 6.5 (H) 09/07/2023        Results for orders placed or performed in visit on 09/07/23  Rheumatoid factor   Collection Time: 09/07/23 10:20 AM  Result Value Ref Range   Rheumatoid fact SerPl-aCnc <10.0 <14.0 IU/mL  CYCLIC CITRUL PEPTIDE ANTIBODY, IGG/IGA   Collection Time: 09/07/23 10:20 AM  Result Value Ref Range   Cyclic Citrullin Peptide Ab 15 0 - 19 units  Sedimentation rate   Collection Time: 09/07/23 10:20 AM  Result Value Ref Range   Sed Rate 38 0 - 40 mm/hr  C-reactive protein   Collection Time: 09/07/23 10:20 AM  Result Value Ref Range   CRP 8 0 - 10 mg/L  ANA w/Reflex   Collection Time:  09/07/23 10:20 AM  Result Value Ref Range   Anti Nuclear Antibody (ANA) Negative Negative  Uric acid   Collection Time: 09/07/23 10:20 AM  Result Value Ref Range   Uric Acid 5.8 3.0 - 7.2 mg/dL  Parvovirus A80 antibody, IgG and IgM   Collection Time: 09/07/23 10:20 AM  Result Value Ref Range   Parvovirus B19 IgG 6.4 (H) 0.0 - 0.8 index   Parvovirus B19 IgM 0.1 0.0 - 0.8 index  CBC with Differential/Platelet   Collection Time: 09/07/23 10:26 AM  Result Value Ref Range   WBC 8.0 3.4 - 10.8 x10E3/uL   RBC 4.66 3.77 - 5.28 x10E6/uL   Hemoglobin 14.8 11.1 - 15.9 g/dL   Hematocrit 55.8 65.9 - 46.6 %   MCV 95 79 - 97 fL   MCH 31.8 26.6 - 33.0 pg   MCHC 33.6 31.5 - 35.7 g/dL   RDW 88.1 88.2 - 84.5 %   Platelets 208 150 - 450 x10E3/uL   Neutrophils 58 Not Estab. %   Lymphs 29 Not Estab. %   Monocytes 9 Not Estab. %   Eos 3 Not Estab. %   Basos 1 Not Estab. %   Neutrophils Absolute 4.6 1.4 - 7.0 x10E3/uL   Lymphocytes Absolute 2.4 0.7 - 3.1 x10E3/uL   Monocytes Absolute 0.8 0.1 - 0.9 x10E3/uL   EOS (ABSOLUTE) 0.3 0.0 - 0.4 x10E3/uL   Basophils Absolute 0.1 0.0 - 0.2 x10E3/uL   Immature Granulocytes 0 Not Estab. %   Immature Grans (Abs) 0.0 0.0 - 0.1 x10E3/uL  Comprehensive metabolic panel with GFR   Collection Time: 09/07/23 10:26 AM  Result Value Ref Range   Glucose 110 (H) 70 - 99 mg/dL   BUN 9 8 - 27 mg/dL   Creatinine, Ser 9.12 0.57 - 1.00 mg/dL   eGFR 75 >40 fO/fpw/8.26   BUN/Creatinine Ratio 10 (L) 12 - 28   Sodium 138 134 - 144 mmol/L   Potassium 4.5 3.5 - 5.2 mmol/L   Chloride 104 96 - 106 mmol/L   CO2 19 (L) 20 - 29 mmol/L   Calcium 9.2 8.7 - 10.3 mg/dL   Total Protein 7.3 6.0 - 8.5 g/dL   Albumin 3.9 3.9 - 4.9 g/dL   Globulin, Total 3.4 1.5 - 4.5 g/dL   Bilirubin Total 0.4 0.0 - 1.2 mg/dL   Alkaline Phosphatase 68 44 - 121 IU/L   AST 24 0 - 40 IU/L   ALT 19 0 - 32 IU/L  TSH   Collection Time: 09/07/23 10:26 AM  Result Value Ref Range   TSH 1.990 0.450 -  4.500 uIU/mL  Lipid panel   Collection Time: 09/07/23 10:26 AM  Result Value Ref Range   Cholesterol, Total 148 100 - 199 mg/dL   Triglycerides 862 0 - 149 mg/dL   HDL 37 (L) >60 mg/dL   VLDL Cholesterol Cal 24 5 - 40 mg/dL   LDL Chol Calc (NIH) 87 0 - 99 mg/dL   Chol/HDL Ratio 4.0 0.0 - 4.4 ratio  Hemoglobin A1c   Collection Time: 09/07/23 10:26 AM  Result Value Ref Range   Hgb A1c MFr Bld 6.5 (H) 4.8 - 5.6 %   Est. average glucose Bld gHb Est-mCnc 140 mg/dL  VITAMIN D  25 Hydroxy (Vit-D Deficiency, Fractures)   Collection Time: 09/07/23 10:26 AM  Result Value Ref Range   Vit D, 25-Hydroxy 27.4 (L) 30.0 - 100.0 ng/mL  Microalbumin/Creatinine Ratio, Urine   Collection Time: 09/07/23 10:29 AM  Result Value Ref Range   Creatinine, Urine 137.8 Not Estab. mg/dL   Microalbumin, Urine 3.9 Not Estab. ug/mL   Microalb/Creat Ratio 3 0 - 29 mg/g creat     Assessment & Plan:   Assessment & Plan Right maxillary sinusitis Symptoms indicate bacterial sinusitis. Clindamycin  selected due to antibiotic allergies. - Prescribed clindamycin  TID. - Advised neti pot for nasal irrigation. - Recommended Claritin for allergies.     Body mass index is 41 kg/m..    Meds ordered this encounter  Medications   clindamycin  (CLEOCIN ) 300 MG capsule    Sig: Take 1 capsule (300 mg total) by mouth 3 (three) times daily.    Dispense:  30 capsule    Refill:  0    No orders of the defined types were placed in this encounter.    Follow-up: No follow-ups on file.  An After Visit Summary was printed and given to the patient.  Abigail Free, MD [facility] [phone]    [1]  Allergies Allergen Reactions   Codeine Anaphylaxis and Other (See Comments)    Throat swelling  Throat swelling, Throat swelling    Throat swelling Throat swelling Throat swelling    Throat swelling Throat swelling    Throat swelling   Prednisone Hypertension and Other (See Comments)   Shellfish  Allergy Anaphylaxis    Other reaction(s): Shock (ALLERGY)  Other reaction(s): Shock (ALLERGY) Other reaction(s): Shock (ALLERGY)   Acetaminophen Rash and Dermatitis   Ampicillin Rash and Dermatitis   Cefdinir Rash and Dermatitis   Ciprofloxacin Nausea And Vomiting and Other (See Comments)   Doxycycline Rash and Dermatitis   Erythromycin Other (See Comments)   Fish Protein-Containing Drug Products Rash and Dermatitis   Hydrocodone-Acetaminophen Nausea And Vomiting, Rash, Dermatitis and Other (See Comments)    ask  ask, ask    ask ask ask    ask ask    ask   Ibuprofen Other (See Comments)    Other reaction(s): Sweating (intolerance)  Reaction: Sweating (intolerance); Other reaction(s): Sweating (intolerance)     Other reaction(s): Sweating (intolerance) Other reaction(s): Sweating (intolerance)    Other reaction(s): Sweating (intolerance)   Silver Nitrate Swelling   Sulfa Antibiotics Dermatitis   Tramadol Other (See Comments)    ask  ask, ask    ask ask ask    ask ask    ask   Sulfacetamide    Tramadol Hcl    Cephalexin Rash   Sulfamethoxazole Rash

## 2024-01-27 NOTE — Telephone Encounter (Signed)
 FYI Only or Action Required?: FYI only for provider: appointment scheduled on 01/27/24.  Patient was last seen in primary care on 12/09/2023 by Nicholaus Credit, PA-C.  Called Nurse Triage reporting Epistaxis.  Symptoms began several days ago.  Interventions attempted: Nothing.  Symptoms are: unchanged.  Triage Disposition: See Physician Within 24 Hours  Patient/caregiver understands and will follow disposition?: Yes   Copied from CRM #[id number]. Topic: Clinical - Red Word Triage >> Jan 27, 2024  8:00 AM Darshell M wrote: [facility] that prompted transfer to Nurse Triage:  Blood when blowing nose for last 3 or 4 days. Lump on right side inside cheek since last night and increased swelling. Call dropped. Attempted to call patient back 3x.  [phone] >> Jan 27, 2024  8:07 AM Farrel B wrote: Pt is returning call due to technical issues and call dropping   Reason for Disposition  [1] Skin bruises or bleeding gums AND [2] not caused by an injury  Answer Assessment - Initial Assessment Questions Scheduled 01/27/24  Advised call back or ED if symptoms worsen.  1. AMOUNT OF BLEEDING: How bad is the bleeding? How much blood was lost? Has the bleeding stopped?     Dry nose, shooting nerve pain in tooth, sore gums, hard knot inside of cheek on right side;size of walnut; hard, painful to touch; swelling, denies redness, right ear throbbing Denies diff breathing, faint, chest pain, dizziness, HA 2. ONSET: When did the nosebleed start?      4 days ago 3. FREQUENCY: How many nosebleeds have you had in the last 24 hours?      None; only when blowing nose have mucous and blood; denies blood clots  5. CAUSE: What do you think caused this nosebleed?     unsure 6. LOCAL FACTORS: Do you have any cold symptoms?, Have you been rubbing or picking at your nose?     Dryness in nose 7. SYSTEMIC FACTORS: Do you have high blood pressure or any bleeding problems?     no 8. BLOOD THINNERS:  Do you take any blood thinners? (e.g., aspirin, clopidogrel / Plavix, coumadin, heparin). Notes: Other strong blood thinners include: Arixtra (fondaparinux), Eliquis (apixaban), Pradaxa (dabigatran), and Xarelto (rivaroxaban).     no 9. OTHER SYMPTOMS: Do you have any other symptoms? (e.g., lightheadedness)     No Right cheek pain when touch, tingling nerve pain, happen years ago, right sinus pressure forehead Denies fever chills n/v  Protocols used: Nosebleed-A-AH

## 2024-02-22 DIAGNOSIS — R6889 Other general symptoms and signs: Secondary | ICD-10-CM | POA: Insufficient documentation

## 2024-03-09 ENCOUNTER — Ambulatory Visit (INDEPENDENT_AMBULATORY_CARE_PROVIDER_SITE_OTHER): Admitting: Physician Assistant

## 2024-03-09 ENCOUNTER — Encounter: Payer: Self-pay | Admitting: Physician Assistant

## 2024-03-09 VITALS — BP 110/78 | HR 91 | Temp 97.2°F | Resp 18 | Ht 59.0 in | Wt 200.4 lb

## 2024-03-09 DIAGNOSIS — I700 Atherosclerosis of aorta: Secondary | ICD-10-CM | POA: Diagnosis not present

## 2024-03-09 DIAGNOSIS — M4316 Spondylolisthesis, lumbar region: Secondary | ICD-10-CM

## 2024-03-09 DIAGNOSIS — J438 Other emphysema: Secondary | ICD-10-CM

## 2024-03-09 DIAGNOSIS — E785 Hyperlipidemia, unspecified: Secondary | ICD-10-CM | POA: Diagnosis not present

## 2024-03-09 DIAGNOSIS — E1165 Type 2 diabetes mellitus with hyperglycemia: Secondary | ICD-10-CM

## 2024-03-09 DIAGNOSIS — E559 Vitamin D deficiency, unspecified: Secondary | ICD-10-CM

## 2024-03-09 DIAGNOSIS — E1169 Type 2 diabetes mellitus with other specified complication: Secondary | ICD-10-CM | POA: Diagnosis not present

## 2024-03-09 NOTE — Progress Notes (Signed)
 "  Established Patient Office Visit  Subjective:  Patient ID: Sherry Montgomery, female    DOB: April 22, 1960  Age: 64 y.o. MRN: [id number]  CC:  Chief Complaint  Patient presents with   Medical Management of Chronic Issues    HPI Sherry Montgomery presents for chronic follow up  Pt with  diabetes -  She has currently managed with diet - is trying to decrease carbs/sugars Voices no concerns or problems  Pt with diagnosis of vit D def  - is not taking supplement  Mixed hyperlipidemia  Pt presents with hyperlipidemia. Compliance with treatment has been good The patient is compliant with medications, maintains a low cholesterol diet , follows up as directed , and maintains an exercise regimen . The patient denies experiencing any hypercholesterolemia related symptoms. Currently taking pravachol  40mg  Pt with known aortic atherosclerosis   Pt recently had back surgery on 02/21/24.  She is healing well and will see surgeon on 03/21/24 for follow up  Pt concerned with labwork done 2 weeks ago at discharge - WBC was elevated to 18.5 and calcium was low at 8.3.  Pt is not having any symptoms - will repeat labwork today Diagnosed spondylolithesis Past Medical History:  Diagnosis Date   Arthritis    Asthma    Diverticulitis    Emphysema, unspecified (HCC) 2024   Noted on CT imaging 2024   Heartburn    History of COVID-19    Kidney stones    Migraine    Prediabetes    Thoracic aortic atherosclerosis 2024   Noted on CT imaging 2024   Tobacco abuse     Past Surgical History:  Procedure Laterality Date   APPENDECTOMY  1985   disc fusion in neck  2022   GALLBLADDER SURGERY  1993   HERNIA REPAIR  1979   ROTATOR CUFF REPAIR  2016   TOTAL ABDOMINAL HYSTERECTOMY  1984    Family History  Problem Relation Age of Onset   Alzheimer's disease Mother    Lupus Father    Lung cancer Sister    Leukemia Sister    Alzheimer's disease Sister    Alzheimer's disease Brother    Breast cancer Neg Hx      Social History   Socioeconomic History   Marital status: Married    Spouse name: Not on file   Number of children: 2   Years of education: Not on file   Highest education level: 12th grade  Occupational History   Not on file  Tobacco Use   Smoking status: Every Day    Current packs/day: 0.50    Average packs/day: 1 pack/day for 52.1 years (51.4 ttl pk-yrs)    Types: Cigarettes    Start date: 7   Smokeless tobacco: Never  Vaping Use   Vaping status: Every Day  Substance and Sexual Activity   Alcohol use: Never   Drug use: Never   Sexual activity: Yes    Partners: Male  Other Topics Concern   Not on file  Social History Narrative   Not on file   Social Drivers of Health   Tobacco Use: High Risk (02/21/2024)   Received from [facility]   Patient History    Smoking Tobacco Use: Some Days    Smokeless Tobacco Use: Never    Passive Exposure: Not on file  Financial Resource Strain: Low Risk (02/21/2024)   Received from [facility]   Overall Financial Resource Strain (CARDIA)    How hard is it  for you to pay for the very basics like food, housing, medical care, and heating?: Not hard at all  Food Insecurity: Low Risk (02/21/2024)   Received from [facility]   Epic    Within the past 12 months, you worried that your food would run out before you got money to buy more: Never true    Within the past 12 months, the food you bought just didn't last and you didn't have money to get more. : Never true  Transportation Needs: No Transportation Needs (02/21/2024)   Received from [facility]    In the past 12 months, has lack of reliable transportation kept you from medical appointments, meetings, work or from getting things needed for daily living? : No  Physical Activity: Inactive (09/07/2023)   Exercise Vital Sign    Days of Exercise per Week: 0 days    Minutes of Exercise per Session: Not on file  Stress: No Stress Concern Present (10/19/2023)   [employer] - Occupational Stress Questionnaire    Feeling of Stress: Not at all  Social Connections: Moderately Integrated (02/21/2024)   Received from [facility]   Social Connection and Isolation Panel    In a typical week, how many times do you talk on the phone with family, friends, or neighbors?: Twice a week    How often do you get together with friends or relatives?: Twice a week    How often do you attend church or religious services?: Patient declined    Do you belong to any clubs or organizations such as church groups, unions, fraternal or athletic groups, or school groups?: Yes    How often do you attend meetings of the clubs or organizations you belong to?: More than 4 times per year    Are you married, widowed, divorced, separated, never married, or living with a partner?: Married  Intimate Partner Violence: Not At Risk (10/19/2023)   Epic    Fear of Current or Ex-Partner: No    Emotionally Abused: No    Physically Abused: No    Sexually Abused: No  Depression (PHQ2-9): Low Risk (03/09/2024)   Depression (PHQ2-9)    PHQ-2 Score: 0  Alcohol Screen: Low Risk (10/19/2023)   Alcohol Screen    Last Alcohol Screening Score (AUDIT): 0  Housing: Low Risk (02/21/2024)   Received from [facility]   Epic    What is your living situation today?: I have a steady place to live    Think about the place you live. Do you have problems with any of the following? Choose all that apply:: None/None on this list  Utilities: Low Risk (02/21/2024)   Received from [facility]   Utilities    In the past 12 months has the electric, gas, oil, or water company threatened to shut off services in your home? : No  Health Literacy: Adequate Health Literacy (10/19/2023)   B1300 Health Literacy    Frequency of need for help with medical instructions: Never     Current Outpatient Medications:    cyclobenzaprine  (FLEXERIL ) 10 MG tablet, Take 10 mg by mouth., Disp: , Rfl:     HYDROmorphone (DILAUDID) 2 MG tablet, Take 2 mg by mouth every 4 (four) hours as needed., Disp: , Rfl:    ketorolac  (TORADOL ) 10 MG tablet, Take 10 mg by mouth., Disp: , Rfl:    methocarbamol (ROBAXIN) 500 MG tablet, Take 500 mg by mouth., Disp: , Rfl:  nystatin  cream (MYCOSTATIN ), Apply 1 Application topically 2 (two) times daily., Disp: 30 g, Rfl: 2   pravastatin  (PRAVACHOL ) 40 MG tablet, Take 1 tablet (40 mg total) by mouth daily., Disp: 90 tablet, Rfl: 3   Allergies  Allergen Reactions   Codeine Anaphylaxis and Other (See Comments)    Throat swelling  Throat swelling, Throat swelling    Throat swelling Throat swelling Throat swelling    Throat swelling Throat swelling    Throat swelling   Prednisone Hypertension and Other (See Comments)   Shellfish Allergy Anaphylaxis    Other reaction(s): Shock (ALLERGY)  Other reaction(s): Shock (ALLERGY) Other reaction(s): Shock (ALLERGY)  Other reaction(s): Shock (ALLERGY)   Other reaction(s): Shock (ALLERGY)  Pt states she is allergic to fish, but she can eat Shellfish (01/31/24)   Acetaminophen Rash and Dermatitis   Ampicillin Rash and Dermatitis   Cefdinir Rash and Dermatitis   Ciprofloxacin Nausea And Vomiting and Other (See Comments)   Doxycycline Rash and Dermatitis   Erythromycin Other (See Comments) and Dermatitis   Fish Protein-Containing Drug Products Rash and Dermatitis   Hydrocodone-Acetaminophen Dermatitis, Nausea And Vomiting, Other (See Comments) and Rash    ask  ask, ask    ask ask ask    ask ask    ask   Ibuprofen Other (See Comments)    Other reaction(s): Sweating (intolerance)  Reaction: Sweating (intolerance); Other reaction(s): Sweating (intolerance)     Other reaction(s): Sweating (intolerance) Other reaction(s): Sweating (intolerance)    Other reaction(s): Sweating (intolerance)  Reaction: Sweating (intolerance)   Silver Nitrate Swelling   Sulfa Antibiotics Dermatitis   Tramadol Other (See  Comments) and Nausea Only    ask  ask, ask    ask ask ask    ask ask    ask  Nausea and vomiting   Sulfacetamide    Tramadol Hcl    Cephalexin Rash   Sulfamethoxazole Rash    CONSTITUTIONAL: Negative for chills, fatigue, fever, unintentional weight gain and unintentional weight loss.  E/N/T: Negative for ear pain, nasal congestion and sore throat.  CARDIOVASCULAR: Negative for chest pain, dizziness, palpitations and pedal edema.  RESPIRATORY: Negative for recent cough and dyspnea.  GASTROINTESTINAL: Negative for abdominal pain, acid reflux symptoms, constipation, diarrhea, nausea and vomiting.  MSK: see HPI INTEGUMENTARY: Negative for rash.  NEUROLOGICAL: Negative for dizziness and headaches.  PSYCHIATRIC: Negative for sleep disturbance and to question depression screen.  Negative for depression, negative for anhedonia.        Objective:  PHYSICAL EXAM:   VS: BP 110/78   Pulse 91   Temp (!) 97.2 F (36.2 C) (Temporal)   Resp 18   Ht 4' 11 (1.499 m)   Wt 200 lb 6.4 oz (90.9 kg)   LMP  (LMP Unknown)   SpO2 98%   BMI 40.48 kg/m   GEN: Well nourished, well developed, in no acute distress  Cardiac: RRR; no murmurs, rubs, or gallops,no edema - Respiratory:  normal respiratory rate and pattern with no distress - normal breath sounds with no rales, rhonchi, wheezes or rubs MS: no deformity or atrophy - wearing back brace Skin: warm and dry, no rash - incision of back healing well Neuro:  Alert and Oriented x 3,  - CN II-Xii grossly intact Psych: euthymic mood, appropriate affect and demeanor   Health Maintenance Due  Topic Date Due   HEMOGLOBIN A1C  03/09/2024     There are no preventive care reminders to display for this patient.  Lab  Results  Component Value Date   TSH 1.990 09/07/2023   Lab Results  Component Value Date   WBC 8.0 09/07/2023   HGB 14.8 09/07/2023   HCT 44.1 09/07/2023   MCV 95 09/07/2023   PLT 208 09/07/2023   Lab Results   Component Value Date   NA 138 09/07/2023   K 4.5 09/07/2023   CO2 19 (L) 09/07/2023   GLUCOSE 110 (H) 09/07/2023   BUN 9 09/07/2023   CREATININE 0.87 09/07/2023   BILITOT 0.4 09/07/2023   ALKPHOS 68 09/07/2023   AST 24 09/07/2023   ALT 19 09/07/2023   PROT 7.3 09/07/2023   ALBUMIN 3.9 09/07/2023   CALCIUM 9.2 09/07/2023   EGFR 75 09/07/2023   Lab Results  Component Value Date   CHOL 148 09/07/2023   Lab Results  Component Value Date   HDL 37 (L) 09/07/2023   Lab Results  Component Value Date   LDLCALC 87 09/07/2023   Lab Results  Component Value Date   TRIG 137 09/07/2023   Lab Results  Component Value Date   CHOLHDL 4.0 09/07/2023   Lab Results  Component Value Date   HGBA1C 6.5 (H) 09/07/2023      Assessment & Plan:   Problem List Items Addressed This Visit   None Visit Diagnoses     Type 2 diabetes mellitus with hyperglycemia, without long-term current use of insulin (HCC)    -  Primary Continue to watch diet Urine microalbumin ordered   Hyperlipidemia associated with diabetes (HCC) Watch diet Continue pravachol  40mg  qd   Vitamin D  insufficiency     Vit D level pending  Aortic atherosclerosis (HCC) Lipid panel Continue pravachol   Other emphysema (HCC) Recommend stop smoking Pt declines inhalers  Morbid obesity (HCC) Efforts at diet/exercise/weight loss     Lumbar spondylolithesis Follow up with ortho as directed                                        No orders of the defined types were placed in this encounter.   Follow-up: Return in about 6 months (around 09/06/2024) for chronic fasting follow-up.    SARA R Jermar Colter, PA-C "

## 2024-03-10 LAB — CBC WITH DIFFERENTIAL/PLATELET
Basophils Absolute: 0.1 x10E3/uL (ref 0.0–0.2)
Basos: 1 %
EOS (ABSOLUTE): 0.3 x10E3/uL (ref 0.0–0.4)
Eos: 3 %
Hematocrit: 44 % (ref 34.0–46.6)
Hemoglobin: 14.5 g/dL (ref 11.1–15.9)
Immature Grans (Abs): 0 x10E3/uL (ref 0.0–0.1)
Immature Granulocytes: 0 %
Lymphocytes Absolute: 2.4 x10E3/uL (ref 0.7–3.1)
Lymphs: 23 %
MCH: 31.9 pg (ref 26.6–33.0)
MCHC: 33 g/dL (ref 31.5–35.7)
MCV: 97 fL (ref 79–97)
Monocytes Absolute: 0.8 x10E3/uL (ref 0.1–0.9)
Monocytes: 8 %
Neutrophils Absolute: 6.7 x10E3/uL (ref 1.4–7.0)
Neutrophils: 65 %
Platelets: 403 x10E3/uL (ref 150–450)
RBC: 4.55 x10E6/uL (ref 3.77–5.28)
RDW: 11.8 % (ref 11.7–15.4)
WBC: 10.3 x10E3/uL (ref 3.4–10.8)

## 2024-03-10 LAB — LIPID PANEL
Chol/HDL Ratio: 5.7 ratio — ABNORMAL HIGH (ref 0.0–4.4)
Cholesterol, Total: 212 mg/dL — ABNORMAL HIGH (ref 100–199)
HDL: 37 mg/dL — ABNORMAL LOW
LDL Chol Calc (NIH): 141 mg/dL — ABNORMAL HIGH (ref 0–99)
Triglycerides: 188 mg/dL — ABNORMAL HIGH (ref 0–149)
VLDL Cholesterol Cal: 34 mg/dL (ref 5–40)

## 2024-03-10 LAB — COMPREHENSIVE METABOLIC PANEL WITH GFR
ALT: 18 IU/L (ref 0–32)
AST: 23 IU/L (ref 0–40)
Albumin: 3.9 g/dL (ref 3.9–4.9)
Alkaline Phosphatase: 85 IU/L (ref 49–135)
BUN/Creatinine Ratio: 11 — ABNORMAL LOW (ref 12–28)
BUN: 10 mg/dL (ref 8–27)
Bilirubin Total: 0.3 mg/dL (ref 0.0–1.2)
CO2: 19 mmol/L — ABNORMAL LOW (ref 20–29)
Calcium: 9.3 mg/dL (ref 8.7–10.3)
Chloride: 102 mmol/L (ref 96–106)
Creatinine, Ser: 0.88 mg/dL (ref 0.57–1.00)
Globulin, Total: 3.5 g/dL (ref 1.5–4.5)
Glucose: 112 mg/dL — ABNORMAL HIGH (ref 70–99)
Potassium: 5 mmol/L (ref 3.5–5.2)
Sodium: 138 mmol/L (ref 134–144)
Total Protein: 7.4 g/dL (ref 6.0–8.5)
eGFR: 74 mL/min/1.73

## 2024-03-10 LAB — VITAMIN D 25 HYDROXY (VIT D DEFICIENCY, FRACTURES): Vit D, 25-Hydroxy: 13.5 ng/mL — ABNORMAL LOW (ref 30.0–100.0)

## 2024-03-10 LAB — HEMOGLOBIN A1C
Est. average glucose Bld gHb Est-mCnc: 134 mg/dL
Hgb A1c MFr Bld: 6.3 % — ABNORMAL HIGH (ref 4.8–5.6)

## 2024-03-13 ENCOUNTER — Ambulatory Visit: Payer: Self-pay | Admitting: Family Medicine

## 2024-09-19 ENCOUNTER — Ambulatory Visit: Admitting: Physician Assistant
